# Patient Record
Sex: Female | Born: 1985 | Race: Black or African American | Hispanic: No | Marital: Single
Health system: Midwestern US, Community
[De-identification: ages and names within clinical notes are randomized; demographics above are authoritative.]

## PROBLEM LIST (undated history)

## (undated) DIAGNOSIS — Z789 Other specified health status: Secondary | ICD-10-CM

## (undated) DIAGNOSIS — O24419 Gestational diabetes mellitus in pregnancy, unspecified control: Secondary | ICD-10-CM

## (undated) HISTORY — PX: FOOT SURGERY: SHX648

---
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
## 2004-01-19 ENCOUNTER — Inpatient Hospital Stay (HOSPITAL_COMMUNITY): Admission: AD | Admit: 2004-01-19 | Discharge: 2004-01-19 | Payer: Self-pay | Admitting: *Deleted

## 2006-08-27 ENCOUNTER — Emergency Department (HOSPITAL_COMMUNITY): Admission: EM | Admit: 2006-08-27 | Discharge: 2006-08-27 | Payer: Self-pay | Admitting: Emergency Medicine

## 2006-10-07 ENCOUNTER — Encounter: Admission: RE | Admit: 2006-10-07 | Discharge: 2006-10-07 | Payer: Self-pay | Admitting: Family Medicine

## 2007-02-12 ENCOUNTER — Ambulatory Visit: Payer: Self-pay | Admitting: Internal Medicine

## 2007-08-29 ENCOUNTER — Emergency Department (HOSPITAL_COMMUNITY): Admission: EM | Admit: 2007-08-29 | Discharge: 2007-08-29 | Payer: Self-pay | Admitting: Emergency Medicine

## 2007-08-31 ENCOUNTER — Emergency Department (HOSPITAL_COMMUNITY): Admission: EM | Admit: 2007-08-31 | Discharge: 2007-08-31 | Payer: Self-pay | Admitting: Family Medicine

## 2008-05-21 ENCOUNTER — Emergency Department (HOSPITAL_COMMUNITY): Admission: EM | Admit: 2008-05-21 | Discharge: 2008-05-21 | Payer: Self-pay | Admitting: Emergency Medicine

## 2008-05-24 ENCOUNTER — Encounter: Admission: RE | Admit: 2008-05-24 | Discharge: 2008-05-24 | Payer: Self-pay | Admitting: Family Medicine

## 2008-06-23 ENCOUNTER — Emergency Department (HOSPITAL_COMMUNITY): Admission: EM | Admit: 2008-06-23 | Discharge: 2008-06-23 | Payer: Self-pay | Admitting: Emergency Medicine

## 2009-04-04 ENCOUNTER — Emergency Department (HOSPITAL_COMMUNITY): Admission: EM | Admit: 2009-04-04 | Discharge: 2009-04-04 | Payer: Self-pay | Admitting: Emergency Medicine

## 2009-05-20 ENCOUNTER — Emergency Department (HOSPITAL_COMMUNITY): Admission: EM | Admit: 2009-05-20 | Discharge: 2009-05-20 | Payer: Self-pay | Admitting: Emergency Medicine

## 2009-08-10 ENCOUNTER — Emergency Department (HOSPITAL_COMMUNITY): Admission: EM | Admit: 2009-08-10 | Discharge: 2009-08-10 | Payer: Self-pay | Admitting: Emergency Medicine

## 2009-09-03 ENCOUNTER — Other Ambulatory Visit: Admission: RE | Admit: 2009-09-03 | Discharge: 2009-09-03 | Payer: Self-pay | Admitting: Family Medicine

## 2010-05-05 NOTE — L&D Delivery Note (Signed)
Delivery Note At 10:23 PM a viable female was delivered via Vaginal, Spontaneous Delivery (Presentation: Left Occiput Anterior).  APGAR: 9, ; weight .   Placenta status: Intact, Spontaneous.  Cord: 3 vessels with the following complications: None.   Anesthesia: None  Episiotomy: None Lacerations: None Est. Blood Loss (mL): 700  Mom to postpartum.  Baby to nursery-stable.  FRAZIER,NATALIE 04/08/2011, 11:43 PM

## 2010-07-24 LAB — URINALYSIS, ROUTINE W REFLEX MICROSCOPIC
Bilirubin Urine: NEGATIVE
Glucose, UA: NEGATIVE mg/dL
Ketones, ur: NEGATIVE mg/dL
Nitrite: NEGATIVE
Protein, ur: NEGATIVE mg/dL
pH: 6.5 (ref 5.0–8.0)

## 2010-07-24 LAB — POCT PREGNANCY, URINE: Preg Test, Ur: NEGATIVE

## 2010-08-12 ENCOUNTER — Inpatient Hospital Stay (INDEPENDENT_AMBULATORY_CARE_PROVIDER_SITE_OTHER)
Admission: RE | Admit: 2010-08-12 | Discharge: 2010-08-12 | Disposition: A | Discharge status: Home / Self Care | Payer: BC Managed Care – PPO | Source: Ambulatory Visit | Attending: Emergency Medicine | Admitting: Emergency Medicine

## 2010-08-12 DIAGNOSIS — Z331 Pregnant state, incidental: Secondary | ICD-10-CM

## 2010-08-12 LAB — POCT URINALYSIS DIP (DEVICE)
Glucose, UA: NEGATIVE mg/dL
Ketones, ur: NEGATIVE mg/dL
pH: 7 (ref 5.0–8.0)

## 2010-08-12 LAB — POCT PREGNANCY, URINE: Preg Test, Ur: POSITIVE

## 2010-08-15 ENCOUNTER — Inpatient Hospital Stay (HOSPITAL_COMMUNITY)
Admission: AD | Admit: 2010-08-15 | Discharge: 2010-08-15 | Disposition: A | Discharge status: Home / Self Care | Payer: BC Managed Care – PPO | Source: Ambulatory Visit | Attending: Obstetrics & Gynecology | Admitting: Obstetrics & Gynecology

## 2010-08-15 DIAGNOSIS — O99891 Other specified diseases and conditions complicating pregnancy: Secondary | ICD-10-CM

## 2010-08-15 DIAGNOSIS — O9989 Other specified diseases and conditions complicating pregnancy, childbirth and the puerperium: Secondary | ICD-10-CM

## 2010-08-31 ENCOUNTER — Emergency Department (HOSPITAL_COMMUNITY)
Admission: EM | Admit: 2010-08-31 | Discharge: 2010-09-01 | Disposition: A | Discharge status: Home / Self Care | Payer: BC Managed Care – PPO | Attending: Emergency Medicine | Admitting: Emergency Medicine

## 2010-08-31 DIAGNOSIS — Y92009 Unspecified place in unspecified non-institutional (private) residence as the place of occurrence of the external cause: Secondary | ICD-10-CM | POA: Insufficient documentation

## 2010-08-31 DIAGNOSIS — H5789 Other specified disorders of eye and adnexa: Secondary | ICD-10-CM | POA: Insufficient documentation

## 2010-08-31 DIAGNOSIS — S0010XA Contusion of unspecified eyelid and periocular area, initial encounter: Secondary | ICD-10-CM | POA: Insufficient documentation

## 2010-09-01 ENCOUNTER — Emergency Department (HOSPITAL_COMMUNITY): Payer: BC Managed Care – PPO

## 2010-09-01 LAB — POCT PREGNANCY, URINE: Preg Test, Ur: POSITIVE

## 2010-10-15 ENCOUNTER — Other Ambulatory Visit: Payer: Self-pay | Admitting: Obstetrics and Gynecology

## 2010-10-16 LAB — RUBELLA ANTIBODY, IGM: Rubella: IMMUNE

## 2010-10-16 LAB — HEPATITIS B SURFACE ANTIGEN: Hepatitis B Surface Ag: NEGATIVE

## 2010-10-16 LAB — ABO/RH: RH Type: POSITIVE

## 2010-10-16 LAB — HIV ANTIBODY (ROUTINE TESTING W REFLEX): HIV: NONREACTIVE

## 2011-04-08 ENCOUNTER — Encounter (HOSPITAL_COMMUNITY): Payer: Self-pay | Admitting: *Deleted

## 2011-04-08 ENCOUNTER — Inpatient Hospital Stay (HOSPITAL_COMMUNITY)
Admission: AD | Admit: 2011-04-08 | Discharge: 2011-04-10 | DRG: 775 | Disposition: A | Discharge status: Home / Self Care | Payer: Medicaid Other | Source: Ambulatory Visit | Attending: Obstetrics & Gynecology | Admitting: Obstetrics & Gynecology

## 2011-04-08 HISTORY — DX: Other specified health status: Z78.9

## 2011-04-08 MED ORDER — OXYTOCIN 10 UNIT/ML IJ SOLN
INTRAMUSCULAR | Status: AC
  Administered 2011-04-08: 10 [IU] via INTRAMUSCULAR
  Filled 2011-04-08: qty 1

## 2011-04-08 MED ORDER — OXYTOCIN 10 UNIT/ML IJ SOLN
10.0000 [IU] | Freq: Once | INTRAMUSCULAR | Status: AC
  Administered 2011-04-08: 10 [IU] via INTRAMUSCULAR

## 2011-04-08 MED ORDER — ERYTHROMYCIN 5 MG/GM OP OINT
TOPICAL_OINTMENT | OPHTHALMIC | Status: AC
  Filled 2011-04-08: qty 1

## 2011-04-08 MED ORDER — FENTANYL CITRATE 0.05 MG/ML IJ SOLN: 100.0000 ug | Freq: Once | INTRAMUSCULAR | Status: DC

## 2011-04-08 MED ORDER — OXYTOCIN 20 UNITS IN LACTATED RINGERS INFUSION - SIMPLE
INTRAVENOUS | Status: AC
  Administered 2011-04-08: 20 [IU] via INTRAVENOUS
  Filled 2011-04-08: qty 1000

## 2011-04-08 MED ORDER — OXYTOCIN 20 UNITS IN LACTATED RINGERS INFUSION - SIMPLE
125.0000 mL/h | INTRAVENOUS | Status: DC | PRN
  Administered 2011-04-08: 20 [IU] via INTRAVENOUS

## 2011-04-08 MED ORDER — FENTANYL CITRATE 0.05 MG/ML IJ SOLN
100.0000 ug | Freq: Once | INTRAMUSCULAR | Status: AC
  Administered 2011-04-08: 100 ug via INTRAVENOUS

## 2011-04-08 MED ORDER — FENTANYL CITRATE 0.05 MG/ML IJ SOLN
INTRAMUSCULAR | Status: AC
  Administered 2011-04-08: 100 ug via INTRAVENOUS
  Filled 2011-04-08: qty 2

## 2011-04-08 NOTE — Progress Notes (Signed)
Contractions present since last night

## 2011-04-08 NOTE — H&P (Signed)
25 year old G1 now P1 who presented to [facility]U at 39 weeks and precipitously delivered @10 :23PM.  I was called by [facility]U at 10:21PM.  Upon my arrival, patient had delivered a viable female infant with Apgars of 9/9 and placenta had already been delivered as well.  Inspection of perineum found no significant tears.  No repairs.  Breast feeding. From history, -GBS.  Ob record available only goes up to mid September.  Was seen in office this AM, contracting some, and was told she was 2cm dilated.  By history had been contracting for two days, although contractions increased in frequency and severity around 9:30 PM this evening.  SROM at 10:20PM.  PE:  VS stable. Abd:  U-2 and Firm.   Bleeding minimal.  IMP:  Term IUP, Delivered Female precipitously in [facility]U Plan:  PP care.

## 2011-04-08 NOTE — ED Provider Notes (Signed)
Pt arrived in [facility]U in active labor, SVE: complete/+2, + FHR, delivered vigorous infant with 1 push, see delivery record. Placenta delivered intact. Uterus boggy immediately postpartum firms with massage, continuous bleeding, 10 u Pitocin given IM, fundus firm following, bleeding stable, IV started, 20 u pitocin in 1000 ml LR initiated. EBL 700. Couplet stable.

## 2011-04-09 ENCOUNTER — Encounter (HOSPITAL_COMMUNITY): Payer: Self-pay

## 2011-04-09 LAB — CBC
Platelets: 230 10*3/uL (ref 150–400)
RBC: 3.97 MIL/uL (ref 3.87–5.11)
WBC: 16.8 10*3/uL — ABNORMAL HIGH (ref 4.0–10.5)

## 2011-04-09 MED ORDER — PRENATAL PLUS 27-1 MG PO TABS
1.0000 | ORAL_TABLET | Freq: Every day | ORAL | Status: DC
  Administered 2011-04-09 – 2011-04-10 (×2): 1 via ORAL
  Filled 2011-04-09 (×2): qty 1

## 2011-04-09 MED ORDER — IBUPROFEN 600 MG PO TABS
600.0000 mg | ORAL_TABLET | Freq: Four times a day (QID) | ORAL | Status: DC
  Administered 2011-04-09 – 2011-04-10 (×6): 600 mg via ORAL
  Filled 2011-04-09 (×6): qty 1

## 2011-04-09 MED ORDER — LANOLIN HYDROUS EX OINT: TOPICAL_OINTMENT | CUTANEOUS | Status: DC | PRN

## 2011-04-09 MED ORDER — ONDANSETRON HCL 4 MG PO TABS: 4.0000 mg | ORAL_TABLET | ORAL | Status: DC | PRN

## 2011-04-09 MED ORDER — SIMETHICONE 80 MG PO CHEW: 80.0000 mg | CHEWABLE_TABLET | ORAL | Status: DC | PRN

## 2011-04-09 MED ORDER — ONDANSETRON HCL 4 MG/2ML IJ SOLN: 4.0000 mg | INTRAMUSCULAR | Status: DC | PRN

## 2011-04-09 MED ORDER — SENNOSIDES-DOCUSATE SODIUM 8.6-50 MG PO TABS
2.0000 | ORAL_TABLET | Freq: Every day | ORAL | Status: DC
  Administered 2011-04-09: 2 via ORAL

## 2011-04-09 MED ORDER — ZOLPIDEM TARTRATE 5 MG PO TABS: 5.0000 mg | ORAL_TABLET | Freq: Every evening | ORAL | Status: DC | PRN

## 2011-04-09 MED ORDER — TETANUS-DIPHTH-ACELL PERTUSSIS 5-2.5-18.5 LF-MCG/0.5 IM SUSP
0.5000 mL | Freq: Once | INTRAMUSCULAR | Status: AC
  Administered 2011-04-10: 0.5 mL via INTRAMUSCULAR
  Filled 2011-04-09: qty 0.5

## 2011-04-09 MED ORDER — BENZOCAINE-MENTHOL 20-0.5 % EX AERO
INHALATION_SPRAY | CUTANEOUS | Status: AC
  Filled 2011-04-09: qty 56

## 2011-04-09 MED ORDER — WITCH HAZEL-GLYCERIN EX PADS: 1.0000 "application " | MEDICATED_PAD | CUTANEOUS | Status: DC | PRN

## 2011-04-09 MED ORDER — BENZOCAINE-MENTHOL 20-0.5 % EX AERO
1.0000 "application " | INHALATION_SPRAY | CUTANEOUS | Status: DC | PRN
  Administered 2011-04-09: 1 via TOPICAL

## 2011-04-09 MED ORDER — OXYCODONE-ACETAMINOPHEN 5-325 MG PO TABS
1.0000 | ORAL_TABLET | ORAL | Status: DC | PRN
  Administered 2011-04-09: 1 via ORAL
  Administered 2011-04-09: 2 via ORAL
  Filled 2011-04-09: qty 1
  Filled 2011-04-09: qty 2

## 2011-04-09 MED ORDER — DIPHENHYDRAMINE HCL 25 MG PO CAPS: 25.0000 mg | ORAL_CAPSULE | Freq: Four times a day (QID) | ORAL | Status: DC | PRN

## 2011-04-09 MED ORDER — DIBUCAINE 1 % RE OINT: 1.0000 "application " | TOPICAL_OINTMENT | RECTAL | Status: DC | PRN

## 2011-04-09 NOTE — Progress Notes (Signed)
Post Partum Day 1 Subjective: no complaints  Objective: Blood pressure 99/68, pulse 85, temperature 98.1 F (36.7 C), temperature source Axillary, resp. rate 18, height 5\' 4"  (1.626 m), weight 78.019 kg (172 lb), SpO2 99.00%, unknown if currently breastfeeding.  Physical Exam:  General: alert Lochia: appropriate Uterine Fundus: firm   Basename 04/09/11 0515  HGB 9.6*  HCT 28.9*    Assessment/Plan: Plan for discharge tomorrow   LOS: 1 day   Esma Kilts D 04/09/2011, 10:02 AM

## 2011-04-09 NOTE — Progress Notes (Signed)
UR chart review completed.  

## 2011-04-10 NOTE — Discharge Summary (Signed)
Obstetric Discharge Summary Reason for Admission: onset of labor Prenatal Procedures: none Intrapartum Procedures: spontaneous vaginal delivery Postpartum Procedures: none Complications-Operative and Postpartum: none Hemoglobin  Date Value Range Status  04/09/2011 9.6* 12.0-15.0 (g/dL) Final     HCT  Date Value Range Status  04/09/2011 28.9* 36.0-46.0 (%) Final    Discharge Diagnoses: Term Pregnancy-delivered  Discharge Information: Date: 04/10/2011 Activity: pelvic rest Diet: routine Medications: Ibuprofen Condition: stable Instructions: refer to practice specific booklet Discharge to: home Follow-up Information    Follow up with Caprice [facility]. Make an appointment in 4 weeks.   Contact information:   [address] [place] [address] [phone]          Newborn Data: Live born female  Birth Weight: 5 lb 11 oz (2580 g) APGAR: 9,   Home with mother.  Areanna Gengler A 04/10/2011, 7:36 AM

## 2011-04-10 NOTE — Progress Notes (Signed)
Patient is eating, ambulating, voiding.  Pain control is good.  Filed Vitals:   04/09/11 1030 04/09/11 1347 04/09/11 2120 04/10/11 0603  BP: 126/82 122/68 126/75 122/82  Pulse: 111 98 100 111  Temp: 98.5 F (36.9 C) 98.6 F (37 C) 98.3 F (36.8 C) 98.4 F (36.9 C)  TempSrc: Oral Oral Oral Oral  Resp: 18 18 18 18   Height:      Weight:      SpO2:        Fundus firm Perineum without swelling.  Lab Results  Component Value Date   WBC 16.8* 04/09/2011   HGB 9.6* 04/09/2011   HCT 28.9* 04/09/2011   MCV 72.8* 04/09/2011   PLT 230 04/09/2011    A/Positive/-- (06/13 0000)/RI  A/P Post partum day 2.  Routine care.  Expect d/c today.    Jaiyon Wander A

## 2011-04-15 ENCOUNTER — Institutional Professional Consult (permissible substitution): Payer: Medicaid Other | Admitting: Pediatrics
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
## 2014-03-06 ENCOUNTER — Encounter (HOSPITAL_COMMUNITY): Payer: Self-pay

## 2014-08-12 ENCOUNTER — Emergency Department (HOSPITAL_COMMUNITY): Payer: BLUE CROSS/BLUE SHIELD

## 2014-08-12 ENCOUNTER — Emergency Department (HOSPITAL_COMMUNITY)
Admission: EM | Admit: 2014-08-12 | Discharge: 2014-08-12 | Disposition: A | Discharge status: Home / Self Care | Payer: BLUE CROSS/BLUE SHIELD | Attending: Emergency Medicine | Admitting: Emergency Medicine

## 2014-08-12 ENCOUNTER — Encounter (HOSPITAL_COMMUNITY): Payer: Self-pay | Admitting: Emergency Medicine

## 2014-08-12 DIAGNOSIS — Z3202 Encounter for pregnancy test, result negative: Secondary | ICD-10-CM | POA: Diagnosis not present

## 2014-08-12 DIAGNOSIS — R002 Palpitations: Secondary | ICD-10-CM | POA: Diagnosis not present

## 2014-08-12 DIAGNOSIS — Z720 Tobacco use: Secondary | ICD-10-CM | POA: Insufficient documentation

## 2014-08-12 DIAGNOSIS — R079 Chest pain, unspecified: Secondary | ICD-10-CM

## 2014-08-12 LAB — URINALYSIS, ROUTINE W REFLEX MICROSCOPIC
Bilirubin Urine: NEGATIVE
Glucose, UA: NEGATIVE mg/dL
KETONES UR: NEGATIVE mg/dL
Leukocytes, UA: NEGATIVE
NITRITE: NEGATIVE
PROTEIN: NEGATIVE mg/dL
Specific Gravity, Urine: 1.005 (ref 1.005–1.030)
Urobilinogen, UA: 0.2 mg/dL (ref 0.0–1.0)
pH: 7 (ref 5.0–8.0)

## 2014-08-12 LAB — URINE MICROSCOPIC-ADD ON

## 2014-08-12 LAB — CBC
HEMATOCRIT: 39.3 % (ref 36.0–46.0)
Hemoglobin: 12.9 g/dL (ref 12.0–15.0)
MCH: 24.4 pg — AB (ref 26.0–34.0)
MCHC: 32.8 g/dL (ref 30.0–36.0)
MCV: 74.3 fL — ABNORMAL LOW (ref 78.0–100.0)
Platelets: 284 10*3/uL (ref 150–400)
RBC: 5.29 MIL/uL — ABNORMAL HIGH (ref 3.87–5.11)
RDW: 13.3 % (ref 11.5–15.5)
WBC: 9.4 10*3/uL (ref 4.0–10.5)

## 2014-08-12 LAB — BASIC METABOLIC PANEL
Anion gap: 10 (ref 5–15)
BUN: 10 mg/dL (ref 6–23)
CHLORIDE: 103 mmol/L (ref 96–112)
CO2: 24 mmol/L (ref 19–32)
Calcium: 8.8 mg/dL (ref 8.4–10.5)
Creatinine, Ser: 0.79 mg/dL (ref 0.50–1.10)
GFR calc Af Amer: >90 mL/min (ref >90)
GLUCOSE: 131 mg/dL — AB (ref 70–99)
POTASSIUM: 3.3 mmol/L — AB (ref 3.5–5.1)
Sodium: 137 mmol/L (ref 135–145)

## 2014-08-12 LAB — I-STAT TROPONIN, ED: Troponin i, poc: 0 ng/mL (ref 0.00–0.08)

## 2014-08-12 LAB — POC URINE PREG, ED: PREG TEST UR: NEGATIVE
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
## 2014-08-12 NOTE — ED Provider Notes (Signed)
CSN: [id number]     Arrival date & time 08/12/14  1334 History   First MD Initiated Contact with Patient 08/12/14 1355     Chief Complaint  Patient presents with  . Chest Pain     (Consider location/radiation/quality/duration/timing/severity/associated sxs/prior Treatment) Patient is a 29 y.o. female presenting with palpitations.  Palpitations Palpitations quality:  Fast Onset quality:  Sudden Duration:  30 minutes Timing:  Constant Progression:  Resolved Chronicity:  Recurrent Context: not caffeine (quit drinking red bull 2 weeks ago), not nicotine (quit smoking a  few days ago) and not stimulant use   Relieved by: food. Worsened by:  Nothing Associated symptoms: no chest pain ("funny feeling in chest"), no diaphoresis, no dizziness, no nausea, no shortness of breath (had feeling of needing to breath deeply, no SOB.  ) and no vomiting     Past Medical History  Diagnosis Date  . No pertinent past medical history    Past Surgical History  Procedure Laterality Date  . Foot surgery     Family History  Problem Relation Age of Onset  . Diabetes Maternal Grandfather   . Diabetes Paternal Grandmother    History  Substance Use Topics  . Smoking status: Current Some Day Smoker    Types: Cigarettes  . Smokeless tobacco: Not on file  . Alcohol Use: No   OB History    Gravida Para Term Preterm AB TAB SAB Ectopic Multiple Living   1 1 1       1      Review of Systems  Constitutional: Negative for diaphoresis.  Respiratory: Negative for shortness of breath (had feeling of needing to breath deeply, no SOB.  ).   Cardiovascular: Positive for palpitations. Negative for chest pain ("funny feeling in chest").  Gastrointestinal: Negative for nausea and vomiting.  Neurological: Negative for dizziness.  All other systems reviewed and are negative.     Allergies  Review of patient's allergies indicates no known allergies.  Home Medications   Prior to Admission medications    Not on File   BP 141/84 mmHg  Pulse 101  Temp(Src) 98.9 F (37.2 C)  Resp 20  SpO2 100%  LMP  (LMP Unknown) Physical Exam  Constitutional: She is oriented to person, place, and time. She appears well-developed and well-nourished. No distress.  HENT:  Head: Normocephalic and atraumatic.  Mouth/Throat: Oropharynx is clear and moist.  Eyes: Conjunctivae are normal. Pupils are equal, round, and reactive to light. No scleral icterus.  Neck: Neck supple.  Cardiovascular: Normal rate, regular rhythm, normal heart sounds and intact distal pulses.   No murmur heard. Pulmonary/Chest: Effort normal and breath sounds normal. No stridor. No respiratory distress. She has no rales.  Abdominal: Soft. Bowel sounds are normal. She exhibits no distension. There is no tenderness.  Musculoskeletal: Normal range of motion.  Neurological: She is alert and oriented to person, place, and time.  Skin: Skin is warm and dry. No rash noted.  Psychiatric: She has a normal mood and affect. Her behavior is normal.  Nursing note and vitals reviewed.   ED Course  Procedures (including critical care time) Labs Review Labs Reviewed  CBC - Abnormal; Notable for the following:    RBC 5.29 (*)    MCV 74.3 (*)    MCH 24.4 (*)    All other components within normal limits  BASIC METABOLIC PANEL - Abnormal; Notable for the following:    Potassium 3.3 (*)    Glucose, Bld 131 (*)  All other components within normal limits  I-STAT TROPOININ, ED    Imaging Review Dg Chest 2 View  08/12/2014   CLINICAL DATA:  Left-sided chest pain  EXAM: CHEST  2 VIEW  COMPARISON:  05/20/2009  FINDINGS: The heart size and mediastinal contours are within normal limits. Both lungs are clear. The visualized skeletal structures are unremarkable.  IMPRESSION: No active cardiopulmonary disease.   Electronically Signed   By: Signa Kell M.D.   On: 08/12/2014 14:44  All radiology studies independently viewed by me.      EKG  Interpretation   Date/Time:  Saturday August 12 2014 13:46:52 EDT Ventricular Rate:  87 PR Interval:  151 QRS Duration: 93 QT Interval:  371 QTC Calculation: 446 R Axis:   71 Text Interpretation:  Sinus arrhythmia Borderline T wave abnormalities No  old tracing to compare Confirmed by [facility]  MD, TREY ([id number]) on 08/12/2014  2:09:55 PM      MDM   Final diagnoses:  Palpitations    29 yo female with palpitations.  Pt denied chest pain and SOB on my interview.  Also denied syncope, nausea, dizziness.  Symptoms had resolved prior to my evaluation.  They seemed to get better after eating.  Her symptoms are inconsistent with ACS or PE.  Could be anxiety or perhaps be related to quitting caffeine and tobacco.  Her ED workup is reassuring, but she will need outpatient follow up.  Given return precautions.    Blake Divine, MD 08/12/14 [phone]

## 2014-08-12 NOTE — Discharge Instructions (Signed)
Palpitations A palpitation is the feeling that your heartbeat is irregular or is faster than normal. It may feel like your heart is fluttering or skipping a beat. Palpitations are usually not a serious problem. However, in some cases, you may need further medical evaluation. CAUSES  Palpitations can be caused by:  Smoking.  Caffeine or other stimulants, such as diet pills or energy drinks.  Alcohol.  Stress and anxiety.  Strenuous physical activity.  Fatigue.  Certain medicines.  Heart disease, especially if you have a history of irregular heart rhythms (arrhythmias), such as atrial fibrillation, atrial flutter, or supraventricular tachycardia.  An improperly working pacemaker or defibrillator. DIAGNOSIS  To find the cause of your palpitations, your health care provider will take your medical history and perform a physical exam. Your health care provider may also have you take a test called an ambulatory electrocardiogram (ECG). An ECG records your heartbeat patterns over a 24-hour period. You may also have other tests, such as:  Transthoracic echocardiogram (TTE). During echocardiography, sound waves are used to evaluate how blood flows through your heart.  Transesophageal echocardiogram (TEE).  Cardiac monitoring. This allows your health care provider to monitor your heart rate and rhythm in real time.  Holter monitor. This is a portable device that records your heartbeat and can help diagnose heart arrhythmias. It allows your health care provider to track your heart activity for several days, if needed.  Stress tests by exercise or by giving medicine that makes the heart beat faster. TREATMENT  Treatment of palpitations depends on the cause of your symptoms and can vary greatly. Most cases of palpitations do not require any treatment other than time, relaxation, and monitoring your symptoms. Other causes, such as atrial fibrillation, atrial flutter, or supraventricular  tachycardia, usually require further treatment. HOME CARE INSTRUCTIONS   Avoid:  Caffeinated coffee, tea, soft drinks, diet pills, and energy drinks.  Chocolate.  Alcohol.  Stop smoking if you smoke.  Reduce your stress and anxiety. Things that can help you relax include:  A method of controlling things in your body, such as your heartbeats, with your mind (biofeedback).  Yoga.  Meditation.  Physical activity such as swimming, jogging, or walking.  Get plenty of rest and sleep. SEEK MEDICAL CARE IF:   You continue to have a fast or irregular heartbeat beyond 24 hours.  Your palpitations occur more often. SEEK IMMEDIATE MEDICAL CARE IF:  You have chest pain or shortness of breath.  You have a severe headache.  You feel dizzy or you faint. MAKE SURE YOU:  Understand these instructions.  Will watch your condition.  Will get help right away if you are not doing well or get worse. Document Released: 04/18/2000 Document Revised: 04/26/2013 Document Reviewed: 06/20/2011 [facility] Patient Information 2015 [employer], [place]. This information is not intended to replace advice given to you by your health care provider. Make sure you discuss any questions you have with your health care provider.   Emergency Department Resource Guide 1) Find a Doctor and Pay Out of Pocket Although you won't have to find out who is covered by your insurance plan, it is a good idea to ask around and get recommendations. You will then need to call the office and see if the doctor you have chosen will accept you as a new patient and what types of options they offer for patients who are self-pay. Some doctors offer discounts or will set up payment plans for their patients who do not have insurance, but  you will need to ask so you aren't surprised when you get to your appointment.  2) Contact Your Local Health Department Not all health departments have doctors that can see patients for sick visits, but  many do, so it is worth a call to see if yours does. If you don't know where your local health department is, you can check in your phone book. The CDC also has a tool to help you locate your state's health department, and many state websites also have listings of all of their local health departments.  3) Find a Elim Clinic If your illness is not likely to be very severe or complicated, you may want to try a walk in clinic. These are popping up all over the country in pharmacies, drugstores, and shopping centers. They're usually staffed by nurse practitioners or physician assistants that have been trained to treat common illnesses and complaints. They're usually fairly quick and inexpensive. However, if you have serious medical issues or chronic medical problems, these are probably not your best option.  No Primary Care Doctor: - Call Health Connect at  [phone] - they can help you locate a primary care doctor that  accepts your insurance, provides certain services, etc. - Physician Referral Service- [phone]  Chronic Pain Problems: Organization         Address  Phone   Notes  [facility]  [phone] Patients need to be referred by their primary care doctor.   Medication Assistance: Organization         Address  Phone   Notes  [facility] Medication [facility] [address] [place], [place] [address] [phone] --Must be a resident of [facility] -- Must have NO insurance coverage whatsoever (no Medicaid/ Medicare, etc.) -- The pt. MUST have a primary care doctor that directs their care regularly and follows them in the community   Med[facility]ist  [phone]   [employer]  [phone]    Agencies that provide inexpensive medical care: Organization         Address  Phone   Notes  [place]  [phone]   [facility]    [phone]   [facility] [place], [place] [address] [phone]   [place] [address], [place] [phone]   Planned Parenthood    [phone]   [facility]    [phone]   [place] and [place] [address], [place] Phone:  [phone], Fax:  [phone] Hours of Operation:  9 am - 6 pm, M-F.  Also accepts Medicaid/Medicare and self-pay.  [facility] [place] [address], [address], [place] Phone: [phone], Fax: [phone]. Hours of Operation:  8:30 am - 5:30 pm, M-F.  Also accepts Medicaid and self-pay.  [facility] [address] [place] Phone: [phone]   [place], [place], [place] [phone], Ext. [phone] Mondays & Thursdays: 7-9 AM.  First 15 patients are seen on a first come, first serve basis.    [place] Providers:  Organization         Address  Phone   Notes  [facility] [address], [place] [phone] Also accepts self-pay patients.  [facility] [address], [place]  [place]  [phone]   [place], [address], [place] [phone]   [place] [address] [place] [phone]   Lucianne Lei [address], [place]   [phone] Only accepts [place] Access [place] patients after they have their name applied to their card.   Self-Pay (no insurance) in [facility]:  Organization         Address  Phone   Notes  Sickle Cell Patients, [facility] [place] [phone]   [facility] [place] [phone]   [facility] [place]  [place], [place], [place] [phone]   [facility]/Dr. Osei-Bonsu  [address] [place] or  [address], [place] [phone] Phone number for both [place] and [place] locations is the same.  [facility] [address] [place] [phone]   [facility] [address], [place] or [address] [phone]   [facility] [address], [place] [phone], phone; [phone], fax Sees patients 1st and 3rd Saturday of every month.  Must not qualify for public or private insurance (i.e. Medicaid, Medicare, [place] Health Choice, Veterans' Benefits)  Household income should be no more than 200% of the poverty level The clinic cannot treat you if you are pregnant or think you are pregnant  Sexually transmitted diseases are not treated at the clinic.    Dental Care: Organization         Address  Phone  Notes  [facility] Department of [facility] [phone] Accepts children up to age 31 who are enrolled in [place] or [place]; pregnant women with a Medicaid card; and children who have applied for Medicaid or [place] Health Choice, but were declined, whose parents can pay a reduced fee at time of service.  [facility] Department of [facility]  [address], [place] [phone] Accepts children up to age 43 who are enrolled in [place] or [place]; pregnant women with a Medicaid card; and children who have applied for Medicaid or [place] Health Choice, but were declined, whose parents can pay a reduced fee at time of service.  [place] Adult [facility] Access PROGRAM  [place] [phone] Patients are seen by appointment only. Walk-ins are not accepted. [place] will see patients 41 years of age and older. Monday - Tuesday (8am-5pm) Most Wednesdays (8:30-5pm) $30 per visit, cash only  [facility] Adult Dental Access PROGRAM  [address], [facility] [phone] Patients are seen by appointment only. Walk-ins are not accepted. [place] will see patients 43 years of age and older. One Wednesday Evening (Monthly: Volunteer Based).  $30 per visit, cash only  [place]  [phone] for adults; Children under age 67, call Graduate Pediatric Dentistry at [phone]. Children aged 40-14, please call [phone] to request a pediatric application.  Dental services are provided in all areas of dental care including fillings, crowns and bridges, complete and partial dentures, implants, gum treatment, root canals, and extractions. Preventive care is also provided. Treatment is provided to both adults and children. Patients are selected via a lottery  and there is often a waiting list.   [facility] [address] [place]  [phone] [email or web address]   [facility] [address] [place], [place] [phone], Ext. [phone] Second and Fourth Thursday of each month, opens at 6:30 AM; Clinic ends at 9 AM.  Patients are seen on a first-come first-served basis, and a limited number are seen during each clinic.   [facility]  [address] Hillard Danker [place], [place] [phone]   Eligibility Requirements You must have lived in [place], [place], or [place] counties for at least the last three months.   You cannot be eligible for state or federal sponsored [employer], including [employer], [place], or [employer].   You generally cannot be eligible for healthcare insurance through your employer.    How to apply: Eligibility screenings are held every Tuesday and Wednesday afternoon from 1:00 pm until 4:00 pm. You do not need an appointment for the interview!  [facility] [address], [place], [place]   [place]  Blue Department  [place]  [phone]    Behavioral Health Resources in the Community: Intensive Outpatient Programs Organization         Address  Phone  Notes  [place]. [address] [place], [place] [phone]   [facility] Outpatient [address] [place], [place]   ADS: Alcohol & Drug Svcs [address], [place], [place]   [place] [address],  [place], [place] or [phone]   Substance Abuse Resources Organization         Address  Phone  Notes  Alcohol and Drug Services  [phone]   [place]  [phone]   [facility] [place]   Chinita Pester  [phone]   Residential & Outpatient Substance Abuse Program  [phone]   Psychological Services Organization         Address  Phone  Notes  [facility] [place]  [place]  [phone]   [place] [address], [place] or [phone]    Mobile Crisis Teams Organization         Address  Phone  Notes  [facility], Mobile Crisis Care Unit  [phone]   [facility] Psych[facility]  [address] [place], [place]   [place] [address], [place] [phone]    Self-Help/Support Groups Organization         Address  Phone             Notes  [place] - variety of support groups  [place] Call for more information  Narcotics Anonymous (NA), [facility] [address], [employer] [place]  2 meetings at this location   Special educational needs teacher         Address  Phone  Notes  ASAP Residential Treatment [place],    [place]  [phone]-([phone]   [facility]  [address], [place] [id number], [place], [place]   [place], [place] [phone]  Admissions: 8am-3pm M-F  Incentives Substance [place] [address]    [place], [place] [phone]   [facility] [address], [place],  [place] [phone]   [facility] [address]  [place], [place]   [facility] - Intensive Outpatient [address], [place], [place]   [facility] ([place].,  [place], [place] [phone] or [phone]   [facility] (RTS) [address] [place], [place] Accepts Medicaid  [facility] [place] [address],  [place] [phone] Substance Abuse/Addiction Treatment   [facility] Organization         Address  Phone  Notes  [facility]  [phone]   Domenic Schwab, PhD [address] Arlis Porta [place], [place]   [phone] or [phone]   [place], [place] [phone]   [address], [place], [place] [phone] Insurance/Medicaid/sponsorship through [facility] and Families [address] [place]                                    [place], [place] [phone] [place] [address], [place] [phone]    Dr. Adele Schilder  [phone]   [facility] Dept. 1) [address] [place] 2) [place] 3)  [place] [address], [place] [phone]  [phone]   [place] [phone] or [phone] (After Hours)

## 2014-08-12 NOTE — ED Notes (Signed)
Md Wofford at bedside.  

## 2015-08-23 DIAGNOSIS — J019 Acute sinusitis, unspecified: Secondary | ICD-10-CM | POA: Diagnosis not present

## 2015-08-23 DIAGNOSIS — J209 Acute bronchitis, unspecified: Secondary | ICD-10-CM | POA: Diagnosis not present

## 2015-09-10 DIAGNOSIS — R002 Palpitations: Secondary | ICD-10-CM | POA: Diagnosis not present

## 2015-09-10 DIAGNOSIS — Z3046 Encounter for surveillance of implantable subdermal contraceptive: Secondary | ICD-10-CM | POA: Diagnosis not present

## 2015-09-10 DIAGNOSIS — F411 Generalized anxiety disorder: Secondary | ICD-10-CM | POA: Diagnosis not present

## 2015-09-14 DIAGNOSIS — R002 Palpitations: Secondary | ICD-10-CM | POA: Diagnosis not present

## 2015-09-15 DIAGNOSIS — R002 Palpitations: Secondary | ICD-10-CM | POA: Diagnosis not present

## 2015-09-18 DIAGNOSIS — F411 Generalized anxiety disorder: Secondary | ICD-10-CM | POA: Diagnosis not present

## 2015-10-19 ENCOUNTER — Other Ambulatory Visit (HOSPITAL_COMMUNITY)
Admission: RE | Admit: 2015-10-19 | Discharge: 2015-10-19 | Disposition: A | Discharge status: Home / Self Care | Payer: BLUE CROSS/BLUE SHIELD | Source: Ambulatory Visit | Attending: Obstetrics and Gynecology | Admitting: Obstetrics and Gynecology

## 2015-10-19 ENCOUNTER — Other Ambulatory Visit: Payer: Self-pay | Admitting: Obstetrics & Gynecology

## 2015-10-19 DIAGNOSIS — Z01419 Encounter for gynecological examination (general) (routine) without abnormal findings: Secondary | ICD-10-CM | POA: Diagnosis not present

## 2015-10-19 DIAGNOSIS — Z113 Encounter for screening for infections with a predominantly sexual mode of transmission: Secondary | ICD-10-CM | POA: Insufficient documentation

## 2015-10-19 DIAGNOSIS — Z1151 Encounter for screening for human papillomavirus (HPV): Secondary | ICD-10-CM | POA: Insufficient documentation

## 2015-10-22 LAB — CYTOLOGY - PAP

## 2015-10-29 DIAGNOSIS — F419 Anxiety disorder, unspecified: Secondary | ICD-10-CM | POA: Diagnosis not present

## 2015-11-13 DIAGNOSIS — R002 Palpitations: Secondary | ICD-10-CM | POA: Diagnosis not present

## 2016-07-14 IMAGING — CR DG CHEST 2V
2 series · 2 of 2 positions shown · non-contrast
Comparison: 05/20/2009

CLINICAL DATA: Left-sided chest pain

EXAM:
CHEST  2 VIEW

[w chest pa]
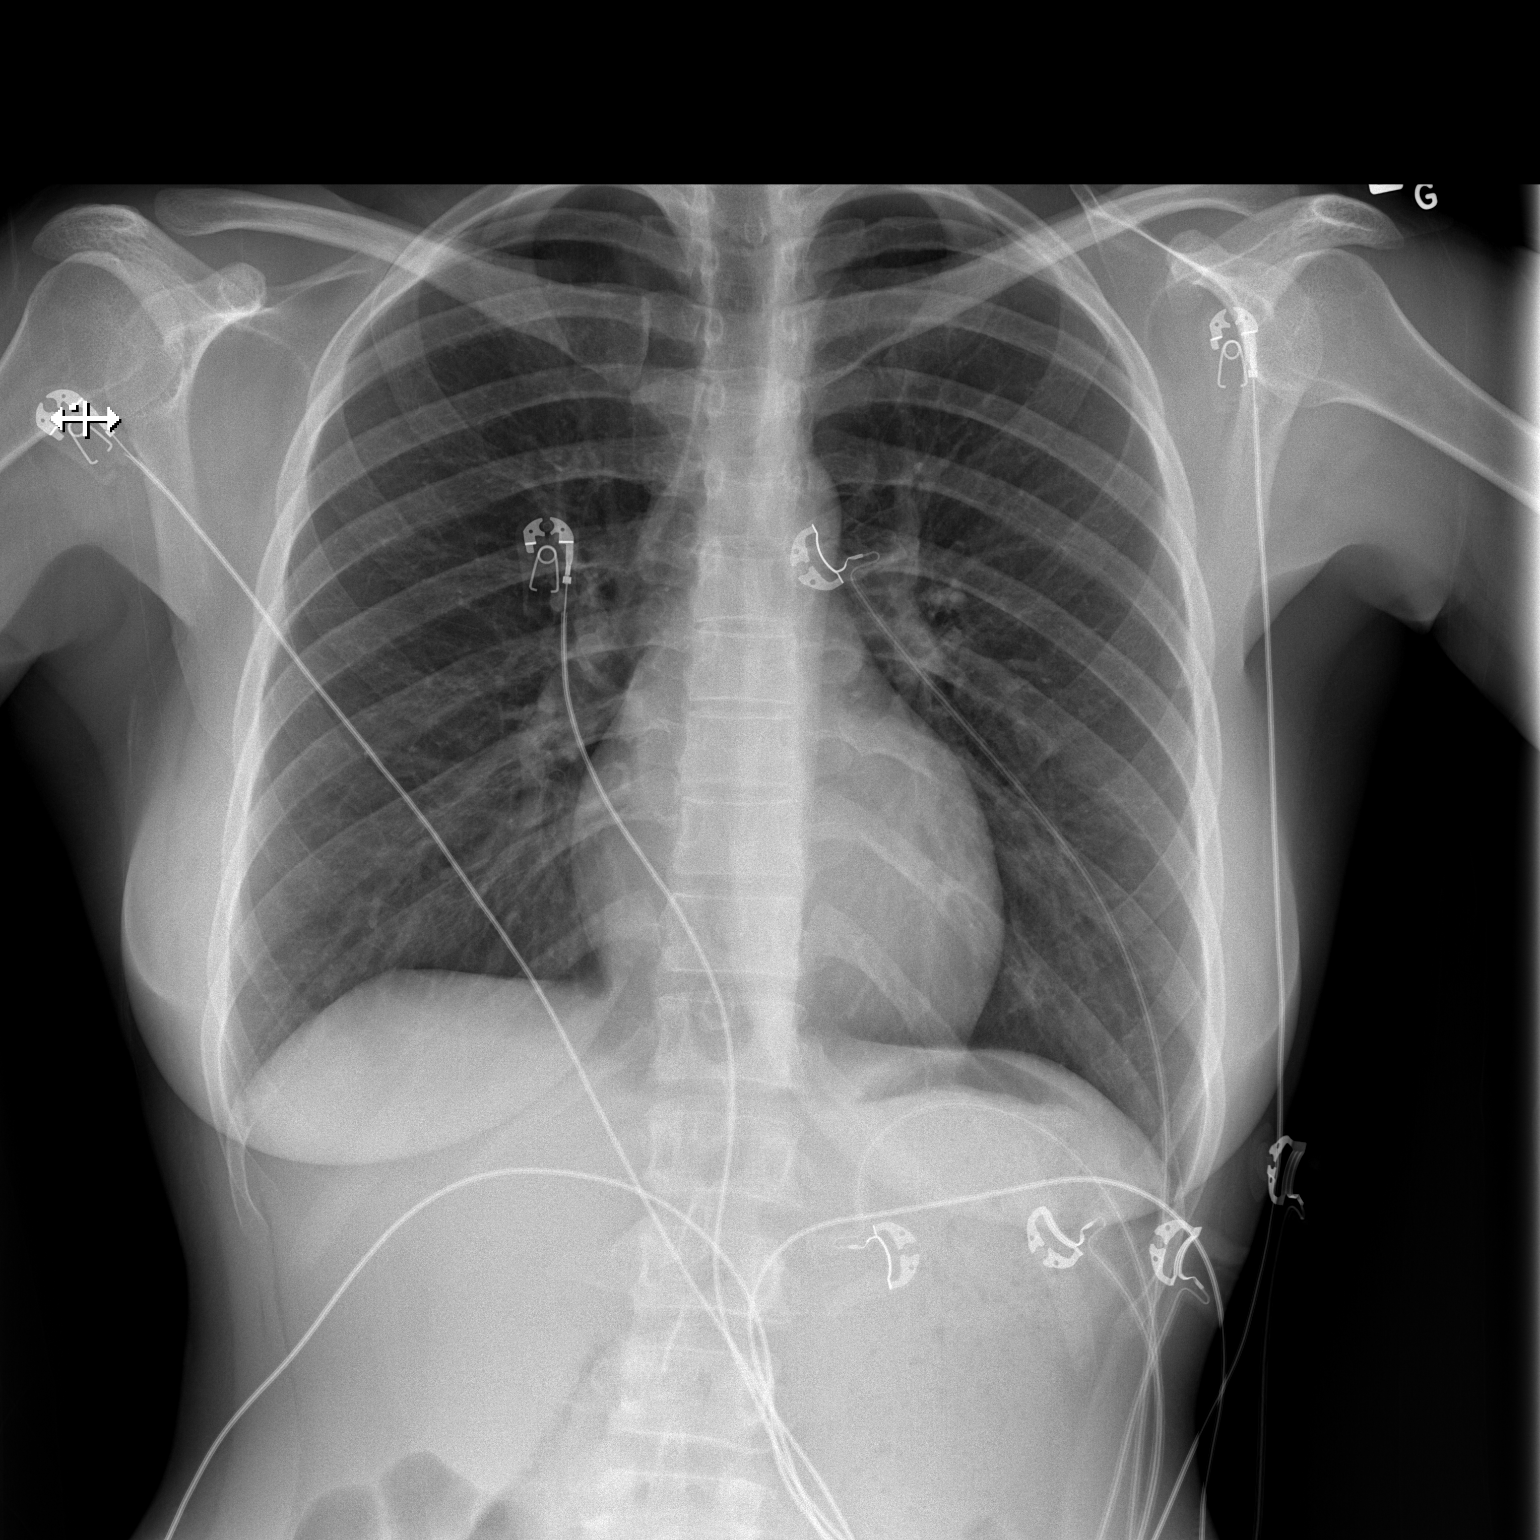

[w chest lat]
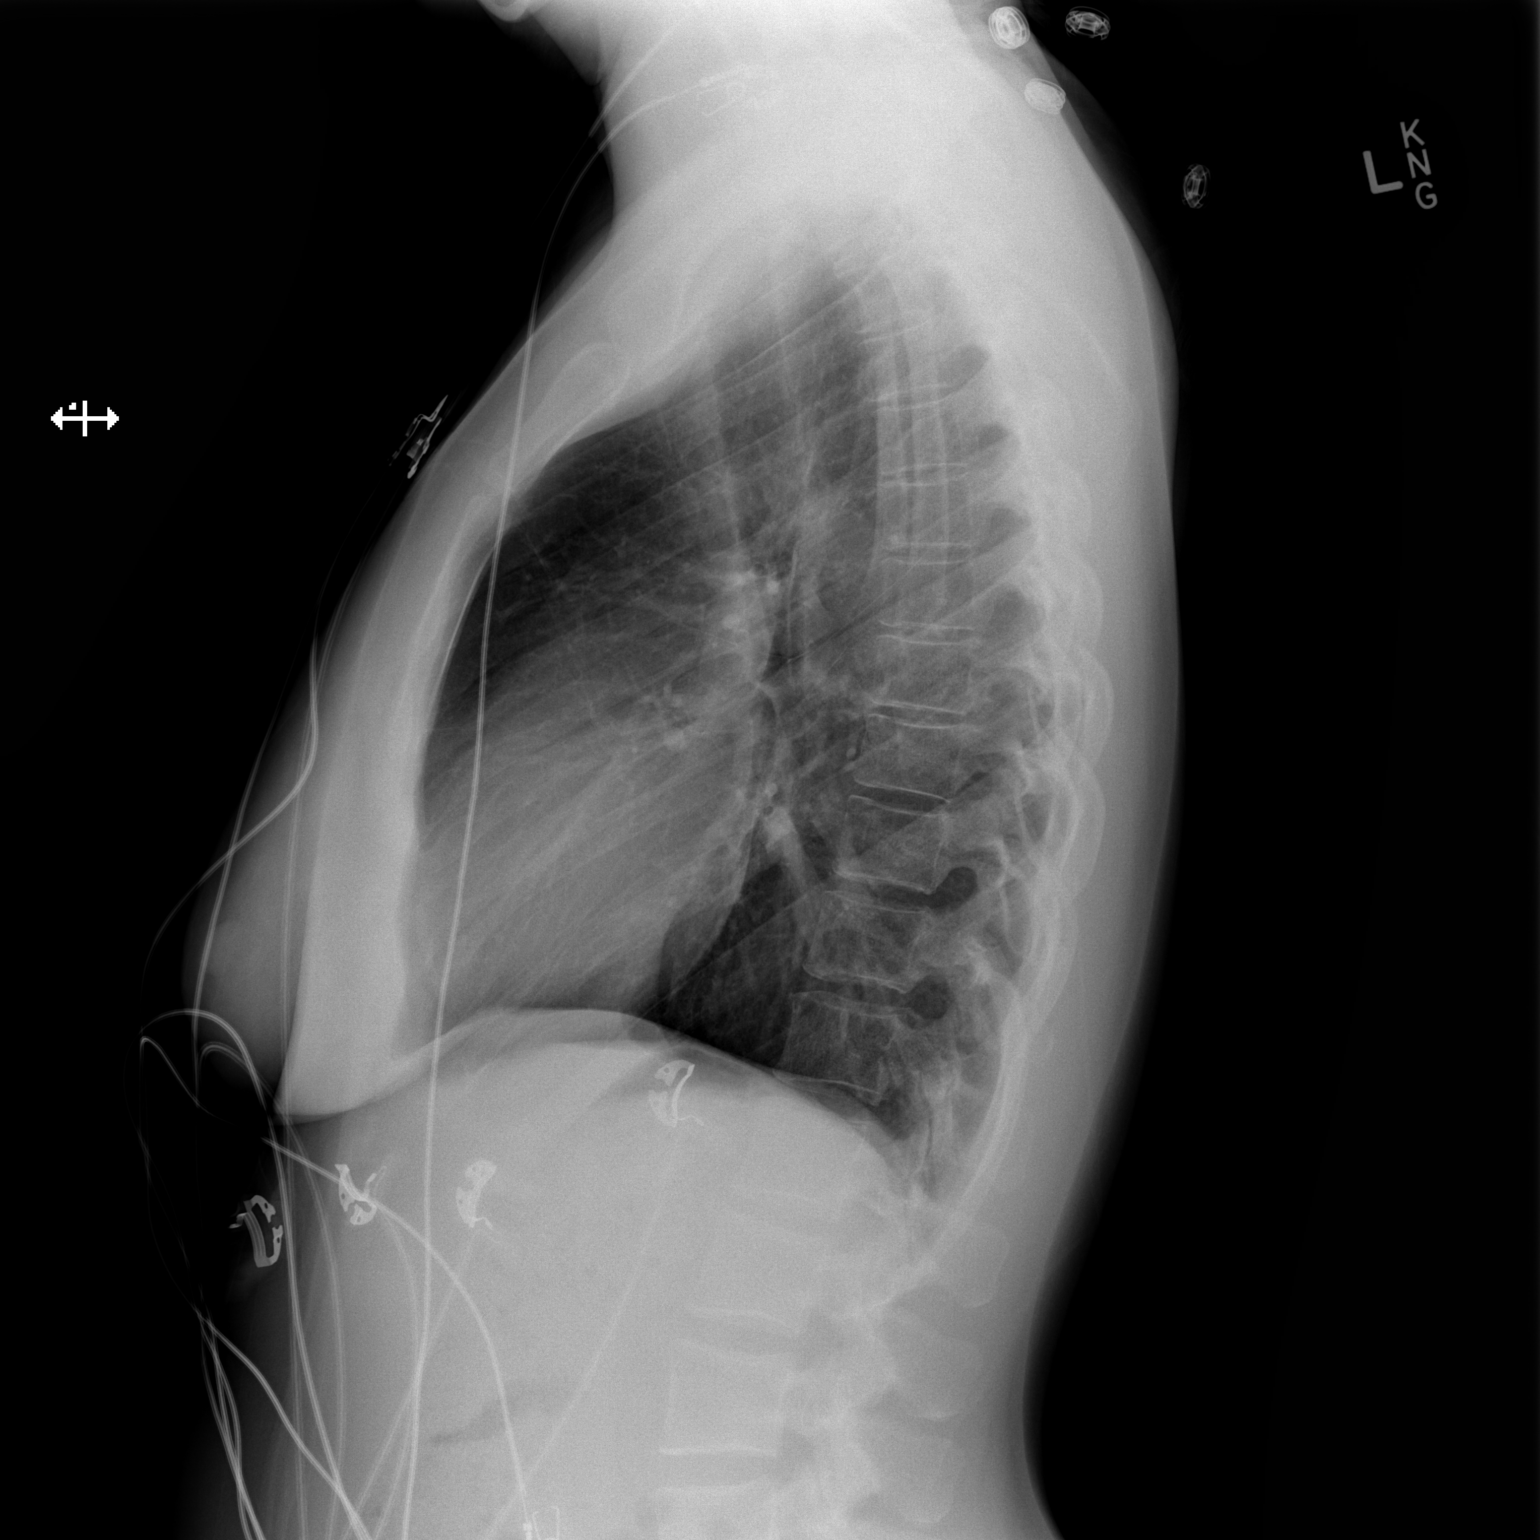

[2 of 2 positions shown; findings below may reference images not displayed]

FINDINGS: The heart size and mediastinal contours are within normal limits.
Both lungs are clear. The visualized skeletal structures are
unremarkable.
IMPRESSION: No active cardiopulmonary disease.

## 2016-08-21 DIAGNOSIS — M79675 Pain in left toe(s): Secondary | ICD-10-CM | POA: Diagnosis not present

## 2016-09-04 ENCOUNTER — Ambulatory Visit (INDEPENDENT_AMBULATORY_CARE_PROVIDER_SITE_OTHER): Payer: BLUE CROSS/BLUE SHIELD | Admitting: Podiatry

## 2016-09-04 ENCOUNTER — Ambulatory Visit (INDEPENDENT_AMBULATORY_CARE_PROVIDER_SITE_OTHER): Payer: BLUE CROSS/BLUE SHIELD

## 2016-09-04 ENCOUNTER — Encounter: Payer: Self-pay | Admitting: Podiatry

## 2016-09-04 DIAGNOSIS — M779 Enthesopathy, unspecified: Secondary | ICD-10-CM

## 2016-09-04 DIAGNOSIS — M778 Other enthesopathies, not elsewhere classified: Secondary | ICD-10-CM

## 2016-09-04 DIAGNOSIS — M2012 Hallux valgus (acquired), left foot: Secondary | ICD-10-CM | POA: Diagnosis not present

## 2016-09-04 DIAGNOSIS — M7752 Other enthesopathy of left foot: Secondary | ICD-10-CM

## 2016-09-04 NOTE — Progress Notes (Signed)
   Subjective:    Patient ID: Stacey Berg, female    DOB: Jun 20, 1985, 31 y.o.   MRN: [id number]  HPI: She presents today with chief complaint of first metatarsophalangeal joint left knee. She states that she injured it while she was walking up stairs by severely dorsiflexing the toe. She's had previous bunion surgery on the right foot but the left foot is developing a bunion as well and now is red and sore. States that she wears toed boots at work and it hurts while working.    Review of Systems  All other systems reviewed and are negative.      Objective:   Physical Exam: Vital signs are stable alert and oriented 3 pulses are palpable. Neurologic system is intact. Deep tendon reflexes are intact. Muscle strength +5 over 5 dorsiflexion plantar flexors and inverters everters all of his musculature is intact. Orthopedic evaluation and x-rays all joints distal to the ankle for range of motion or crepitation. 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 Radiographs today do demonstrate an increased first metatarsal angle and hallux abductus angle soft tissue increase in density along the medial aspect of the first metatarsophalangeal joint left foot. No open lesions or wounds are noted.      Assessment & Plan:  Capsulitis hallux valgus deformity left foot.  Plan: Discussed etiology pathology concerned versus surgical therapies. I injected the area today with dexamethasone and local anesthetic I will follow-up with her in 1 month.

## 2016-10-02 ENCOUNTER — Ambulatory Visit: Payer: BLUE CROSS/BLUE SHIELD | Admitting: Podiatry

## 2016-10-09 ENCOUNTER — Ambulatory Visit (INDEPENDENT_AMBULATORY_CARE_PROVIDER_SITE_OTHER): Payer: BLUE CROSS/BLUE SHIELD | Admitting: Podiatry

## 2016-10-09 ENCOUNTER — Encounter: Payer: Self-pay | Admitting: Podiatry

## 2016-10-09 DIAGNOSIS — M7752 Other enthesopathy of left foot: Secondary | ICD-10-CM

## 2016-10-09 DIAGNOSIS — M778 Other enthesopathies, not elsewhere classified: Secondary | ICD-10-CM

## 2016-10-09 DIAGNOSIS — M2012 Hallux valgus (acquired), left foot: Secondary | ICD-10-CM

## 2016-10-09 DIAGNOSIS — M779 Enthesopathy, unspecified: Secondary | ICD-10-CM

## 2016-10-09 MED ORDER — CELECOXIB 200 MG PO CAPS: 200.0000 mg | ORAL_CAPSULE | Freq: Two times a day (BID) | ORAL | 3 refills | Status: DC

## 2016-10-09 NOTE — Progress Notes (Signed)
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 She states it is doing a little bit better as she refers to about 70%.  Objective: House abductovalgus deformity left foot. Mildly tender on palpation today but not nearly as swollen and painful as it was previous visit.  Assessment: Capsulitis resolving 70%.  Plan: I instructed her to follow up with me in the fall of proximal Lasix weeks prior to time for surgery. The consent form consisting [place] bunionectomy with a long-arm left foot.

## 2017-02-26 DIAGNOSIS — Z720 Tobacco use: Secondary | ICD-10-CM | POA: Diagnosis not present

## 2017-02-26 DIAGNOSIS — Z3045 Encounter for surveillance of transdermal patch hormonal contraceptive device: Secondary | ICD-10-CM | POA: Diagnosis not present

## 2017-08-03 DIAGNOSIS — M76829 Posterior tibial tendinitis, unspecified leg: Secondary | ICD-10-CM | POA: Diagnosis not present

## 2017-08-03 DIAGNOSIS — M79671 Pain in right foot: Secondary | ICD-10-CM | POA: Diagnosis not present

## 2017-08-03 DIAGNOSIS — M25571 Pain in right ankle and joints of right foot: Secondary | ICD-10-CM | POA: Diagnosis not present

## 2017-08-13 DIAGNOSIS — M25571 Pain in right ankle and joints of right foot: Secondary | ICD-10-CM | POA: Diagnosis not present

## 2017-09-14 DIAGNOSIS — M79671 Pain in right foot: Secondary | ICD-10-CM | POA: Diagnosis not present

## 2017-09-14 DIAGNOSIS — M76829 Posterior tibial tendinitis, unspecified leg: Secondary | ICD-10-CM | POA: Diagnosis not present

## 2017-09-14 DIAGNOSIS — M25571 Pain in right ankle and joints of right foot: Secondary | ICD-10-CM | POA: Diagnosis not present

## 2017-09-21 DIAGNOSIS — M25571 Pain in right ankle and joints of right foot: Secondary | ICD-10-CM | POA: Diagnosis not present

## 2017-10-07 DIAGNOSIS — M25571 Pain in right ankle and joints of right foot: Secondary | ICD-10-CM | POA: Diagnosis not present

## 2017-10-07 DIAGNOSIS — M76821 Posterior tibial tendinitis, right leg: Secondary | ICD-10-CM | POA: Diagnosis not present

## 2017-10-26 DIAGNOSIS — M2011 Hallux valgus (acquired), right foot: Secondary | ICD-10-CM | POA: Diagnosis not present

## 2017-10-26 DIAGNOSIS — M76821 Posterior tibial tendinitis, right leg: Secondary | ICD-10-CM | POA: Diagnosis not present

## 2017-10-26 DIAGNOSIS — M79671 Pain in right foot: Secondary | ICD-10-CM | POA: Diagnosis not present

## 2017-11-23 DIAGNOSIS — M25571 Pain in right ankle and joints of right foot: Secondary | ICD-10-CM | POA: Diagnosis not present

## 2017-11-23 DIAGNOSIS — M76821 Posterior tibial tendinitis, right leg: Secondary | ICD-10-CM | POA: Diagnosis not present

## 2017-11-23 DIAGNOSIS — M6701 Short Achilles tendon (acquired), right ankle: Secondary | ICD-10-CM | POA: Diagnosis not present

## 2017-12-22 ENCOUNTER — Ambulatory Visit (INDEPENDENT_AMBULATORY_CARE_PROVIDER_SITE_OTHER): Payer: BLUE CROSS/BLUE SHIELD

## 2017-12-22 ENCOUNTER — Ambulatory Visit: Payer: BLUE CROSS/BLUE SHIELD | Admitting: Podiatry

## 2017-12-22 DIAGNOSIS — M2011 Hallux valgus (acquired), right foot: Secondary | ICD-10-CM

## 2017-12-22 DIAGNOSIS — M2012 Hallux valgus (acquired), left foot: Secondary | ICD-10-CM

## 2017-12-22 NOTE — Progress Notes (Signed)
She presents today states that my right foot is giving me a lot of grief I would like to consider surgical intervention on the right foot but I seen Dr. Victorino DikeHewitt who suggested that I have lot more surgery on my foot and I feel like I need.  She would like for me to take a look at the MRI and the MRI over read.  Objective: Vital signs are stable she is alert and oriented x3.  Pulses are palpable.  She has pain on palpation of the first metatarsophalangeal joint right over left though the right foot has been corrected before it is still more painful due to the nerve overlying reactive hypertrophic medial condyle of the head of the first metatarsal right foot.  Left foot does demonstrate significant bunion deformity and hallux interphalangeal.  Ambulatory evaluation today does demonstrate pes planus though she has no pain on palpation of the posterior tibial tendon.  Radiographs demonstrate mild pes planus hallux valgus deformity of the right foot been surgically corrected before.  She also had an aching osteotomy performed to the right hallux.  Left foot does demonstrate worse bunion deformity with increase in the first intermetatarsal angle greater than normal value hallux abductus angle greater than normal value.  Hallux interphalangeus is also present.  Assessment pes planus hallux valgus more painful on the right than on the left  Plan: She will follow-up with me in a couple of weeks when she brings me the MRI read and will allow me to read the MRI with the explanation.  We will discuss surgery and what is necessary what is not necessary.

## 2018-01-12 ENCOUNTER — Ambulatory Visit: Payer: BLUE CROSS/BLUE SHIELD | Admitting: Podiatry

## 2018-01-26 ENCOUNTER — Ambulatory Visit: Payer: BLUE CROSS/BLUE SHIELD | Admitting: Podiatry

## 2018-02-04 ENCOUNTER — Encounter (INDEPENDENT_AMBULATORY_CARE_PROVIDER_SITE_OTHER): Payer: BLUE CROSS/BLUE SHIELD | Admitting: Podiatry

## 2018-02-04 NOTE — Progress Notes (Signed)
This encounter was created in error - please disregard.

## 2018-05-05 NOTE — L&D Delivery Note (Signed)
Delivery Note At 11:34 AM a viable female was delivered via Vaginal, Spontaneous (Presentation:OA ).  APGAR: 9, 9; weight pending  .   Placenta status: L&D.  Cord:  with the following complications: none .  Cord pH: n/a  Anesthesia:   Episiotomy: None Lacerations: 1st degree Suture Repair: 3.0 vicryl Est. Blood Loss (mL): 100  Mom to postpartum.  Baby to Couplet care / Skin to Skin.  Stacey Berg 03/28/2019, 12:23 PM

## 2018-06-19 DIAGNOSIS — R1030 Lower abdominal pain, unspecified: Secondary | ICD-10-CM | POA: Diagnosis not present

## 2018-06-21 DIAGNOSIS — Z0000 Encounter for general adult medical examination without abnormal findings: Secondary | ICD-10-CM | POA: Diagnosis not present

## 2018-06-21 DIAGNOSIS — Z5181 Encounter for therapeutic drug level monitoring: Secondary | ICD-10-CM | POA: Diagnosis not present

## 2018-06-21 DIAGNOSIS — Z136 Encounter for screening for cardiovascular disorders: Secondary | ICD-10-CM | POA: Diagnosis not present

## 2018-07-07 DIAGNOSIS — M25562 Pain in left knee: Secondary | ICD-10-CM | POA: Diagnosis not present

## 2018-07-23 DIAGNOSIS — Z113 Encounter for screening for infections with a predominantly sexual mode of transmission: Secondary | ICD-10-CM | POA: Diagnosis not present

## 2018-07-23 DIAGNOSIS — Z01419 Encounter for gynecological examination (general) (routine) without abnormal findings: Secondary | ICD-10-CM | POA: Diagnosis not present

## 2018-08-27 DIAGNOSIS — Z3480 Encounter for supervision of other normal pregnancy, unspecified trimester: Secondary | ICD-10-CM | POA: Diagnosis not present

## 2018-08-27 DIAGNOSIS — Z3201 Encounter for pregnancy test, result positive: Secondary | ICD-10-CM | POA: Diagnosis not present

## 2018-09-02 DIAGNOSIS — O09521 Supervision of elderly multigravida, first trimester: Secondary | ICD-10-CM | POA: Diagnosis not present

## 2018-09-02 DIAGNOSIS — O26891 Other specified pregnancy related conditions, first trimester: Secondary | ICD-10-CM | POA: Diagnosis not present

## 2018-09-21 DIAGNOSIS — O09521 Supervision of elderly multigravida, first trimester: Secondary | ICD-10-CM | POA: Diagnosis not present

## 2018-09-28 DIAGNOSIS — Z3480 Encounter for supervision of other normal pregnancy, unspecified trimester: Secondary | ICD-10-CM | POA: Diagnosis not present

## 2018-10-03 ENCOUNTER — Other Ambulatory Visit: Payer: Self-pay

## 2018-10-03 ENCOUNTER — Inpatient Hospital Stay (HOSPITAL_COMMUNITY): Payer: BLUE CROSS/BLUE SHIELD

## 2018-10-03 ENCOUNTER — Inpatient Hospital Stay (HOSPITAL_COMMUNITY)
Admission: EM | Admit: 2018-10-03 | Discharge: 2018-10-03 | Disposition: A | Discharge status: Home / Self Care | Payer: BLUE CROSS/BLUE SHIELD | Source: Ambulatory Visit | Attending: Obstetrics & Gynecology | Admitting: Obstetrics & Gynecology

## 2018-10-03 ENCOUNTER — Encounter (HOSPITAL_COMMUNITY): Payer: Self-pay

## 2018-10-03 DIAGNOSIS — O99331 Smoking (tobacco) complicating pregnancy, first trimester: Secondary | ICD-10-CM | POA: Diagnosis not present

## 2018-10-03 DIAGNOSIS — F17210 Nicotine dependence, cigarettes, uncomplicated: Secondary | ICD-10-CM | POA: Insufficient documentation

## 2018-10-03 DIAGNOSIS — O468X1 Other antepartum hemorrhage, first trimester: Secondary | ICD-10-CM | POA: Diagnosis not present

## 2018-10-03 DIAGNOSIS — O418X10 Other specified disorders of amniotic fluid and membranes, first trimester, not applicable or unspecified: Secondary | ICD-10-CM

## 2018-10-03 DIAGNOSIS — Z3A11 11 weeks gestation of pregnancy: Secondary | ICD-10-CM | POA: Diagnosis not present

## 2018-10-03 DIAGNOSIS — Z6790 Unspecified blood type, Rh positive: Secondary | ICD-10-CM

## 2018-10-03 DIAGNOSIS — O208 Other hemorrhage in early pregnancy: Secondary | ICD-10-CM | POA: Diagnosis not present

## 2018-10-03 DIAGNOSIS — Z3A12 12 weeks gestation of pregnancy: Secondary | ICD-10-CM | POA: Insufficient documentation

## 2018-10-03 DIAGNOSIS — O209 Hemorrhage in early pregnancy, unspecified: Secondary | ICD-10-CM | POA: Diagnosis not present

## 2018-10-03 DIAGNOSIS — O26851 Spotting complicating pregnancy, first trimester: Secondary | ICD-10-CM | POA: Diagnosis not present

## 2018-10-03 DIAGNOSIS — Z79899 Other long term (current) drug therapy: Secondary | ICD-10-CM | POA: Insufficient documentation

## 2018-10-03 DIAGNOSIS — Z3491 Encounter for supervision of normal pregnancy, unspecified, first trimester: Secondary | ICD-10-CM

## 2018-10-03 LAB — URINALYSIS, ROUTINE W REFLEX MICROSCOPIC
Bacteria, UA: NONE SEEN
Bilirubin Urine: NEGATIVE
Glucose, UA: NEGATIVE mg/dL
Ketones, ur: NEGATIVE mg/dL
Leukocytes,Ua: NEGATIVE
Nitrite: NEGATIVE
Protein, ur: NEGATIVE mg/dL
Specific Gravity, Urine: 1.003 — ABNORMAL LOW (ref 1.005–1.030)
pH: 6 (ref 5.0–8.0)

## 2018-10-03 LAB — WET PREP, GENITAL
Clue Cells Wet Prep HPF POC: NONE SEEN
Sperm: NONE SEEN
Trich, Wet Prep: NONE SEEN
Yeast Wet Prep HPF POC: NONE SEEN

## 2018-10-03 LAB — CBC
HCT: 32.4 % — ABNORMAL LOW (ref 36.0–46.0)
Hemoglobin: 10.8 g/dL — ABNORMAL LOW (ref 12.0–15.0)
MCH: 24.2 pg — ABNORMAL LOW (ref 26.0–34.0)
MCHC: 33.3 g/dL (ref 30.0–36.0)
MCV: 72.6 fL — ABNORMAL LOW (ref 80.0–100.0)
Platelets: 291 10*3/uL (ref 150–400)
RBC: 4.46 MIL/uL (ref 3.87–5.11)
RDW: 13.1 % (ref 11.5–15.5)
WBC: 10.6 10*3/uL — ABNORMAL HIGH (ref 4.0–10.5)
nRBC: 0 % (ref 0.0–0.2)

## 2018-10-03 NOTE — MAU Note (Signed)
Pt reports she was laying in bed and rolled over and felt like her underwear was wet. States she went to the bathroom and noticed it was blood. States that it did leak on her sheets. Did not notice any clots or tissue. Pt reports she is [redacted] weeks pregnant. Pt denies pain. LMP: around 07/25/18

## 2018-10-03 NOTE — MAU Provider Note (Addendum)
Chief Complaint: Vaginal Bleeding   None     SUBJECTIVE HPI: Stacey Berg is a 33 y.o. G2P1001 at [redacted]w[redacted]d by LMP who presents to maternity admissions reporting onset of bright red vaginal bleeding this morning.  She reports she was sleeping and felt wet. She went to the bathroom and saw the bleeding, enough to wet her underwear and fill the toilet. She did not see any clots or tissue.  She did not wear a pad to MAU and reports seeing light red in her underwear but they are not soaked.  There is no pain. Ultrasound in the office at 8 weeks was normal.  She denies recent intercourse or strenuous activity.  She had itching, vaginal discharge several days ago, none today.  HPI  Past Medical History:  Diagnosis Date  . No pertinent past medical history    Past Surgical History:  Procedure Laterality Date  . FOOT SURGERY     Social History   Socioeconomic History  . Marital status: Single    Spouse name: Not on file  . Number of children: Not on file  . Years of education: Not on file  . Highest education level: Not on file  Occupational History  . Not on file  Social Needs  . Financial resource strain: Not on file  . Food insecurity:    Worry: Not on file    Inability: Not on file  . Transportation needs:    Medical: Not on file    Non-medical: Not on file  Tobacco Use  . Smoking status: Current Some Day Smoker    Types: Cigarettes  . Smokeless tobacco: Never Used  Substance and Sexual Activity  . Alcohol use: No  . Drug use: No  . Sexual activity: Yes    Birth control/protection: None  Lifestyle  . Physical activity:    Days per week: Not on file    Minutes per session: Not on file  . Stress: Not on file  Relationships  . Social connections:    Talks on phone: Not on file    Gets together: Not on file    Attends religious service: Not on file    Active member of club or organization: Not on file    Attends meetings of clubs or organizations: Not on file   Relationship status: Not on file  . Intimate partner violence:    Fear of current or ex partner: Not on file    Emotionally abused: Not on file    Physically abused: Not on file    Forced sexual activity: Not on file  Other Topics Concern  . Not on file  Social History Narrative  . Not on file   No current facility-administered medications on file prior to encounter.    Current Outpatient Medications on File Prior to Encounter  Medication Sig Dispense Refill  . ALPRAZolam (XANAX) 0.5 MG tablet     . celecoxib (CELEBREX) 200 MG capsule Take 1 capsule (200 mg total) by mouth 2 (two) times daily. 60 capsule 3  . diclofenac sodium (VOLTAREN) 1 % GEL diclofenac 1 % topical gel     No Known Allergies  ROS:  Review of Systems  Constitutional: Negative for chills, fatigue and fever.  HENT: Negative for sinus pressure.   Eyes: Negative for photophobia.  Respiratory: Negative for shortness of breath.   Cardiovascular: Negative for chest pain.  Gastrointestinal: Negative for constipation, diarrhea, nausea and vomiting.  Genitourinary: Positive for vaginal bleeding. Negative for difficulty urinating,  dysuria, flank pain, frequency, pelvic pain, vaginal discharge and vaginal pain.  Musculoskeletal: Negative for neck pain.  Neurological: Negative for dizziness, weakness and headaches.  Psychiatric/Behavioral: Negative.      I have reviewed patient's Past Medical Hx, Surgical Hx, Family Hx, Social Hx, medications and allergies.   Physical Exam   Patient Vitals for the past 24 hrs:  BP Temp Temp src Pulse Resp SpO2 Height Weight  10/03/18 0346 135/83 - - 91 18 - - -  10/03/18 0333 126/75 98.2 F (36.8 C) Oral 92 16 100 % 5\' 4"  (1.626 m) 61.6 kg   Constitutional: Well-developed, well-nourished female in no acute distress.  Cardiovascular: normal rate Respiratory: normal effort GI: Abd soft, non-tender. Pos BS x 4 MS: Extremities nontender, no edema, normal ROM Neurologic: Alert and  oriented x 4.  GU: Neg CVAT.  PELVIC EXAM: Cervix pink, visually closed, without lesion, scant pink/light red discharge, no foxswab used to visualize cervix, vaginal walls and external genitalia normal Bimanual exam: Cervix 0/long/high, firm, anterior, neg CMT, uterus nontender, ~ 11 week size, adnexa without tenderness, enlargement, or mass  FHT 161 by doppler  LAB RESULTS Results for orders placed or performed during the hospital encounter of 10/03/18 (from the past 24 hour(s))  Urinalysis, Routine w reflex microscopic     Status: Abnormal   Collection Time: 10/03/18  3:56 AM  Result Value Ref Range   Color, Urine YELLOW YELLOW   APPearance HAZY (A) CLEAR   Specific Gravity, Urine 1.003 (L) 1.005 - 1.030   pH 6.0 5.0 - 8.0   Glucose, UA NEGATIVE NEGATIVE mg/dL   Hgb urine dipstick MODERATE (A) NEGATIVE   Bilirubin Urine NEGATIVE NEGATIVE   Ketones, ur NEGATIVE NEGATIVE mg/dL   Protein, ur NEGATIVE NEGATIVE mg/dL   Nitrite NEGATIVE NEGATIVE   Leukocytes,Ua NEGATIVE NEGATIVE   RBC / HPF 0-5 0 - 5 RBC/hpf   WBC, UA 0-5 0 - 5 WBC/hpf   Bacteria, UA NONE SEEN NONE SEEN  CBC     Status: Abnormal   Collection Time: 10/03/18  4:22 AM  Result Value Ref Range   WBC 10.6 (H) 4.0 - 10.5 K/uL   RBC 4.46 3.87 - 5.11 MIL/uL   Hemoglobin 10.8 (L) 12.0 - 15.0 g/dL   HCT 16.132.4 (L) 09.636.0 - 04.546.0 %   MCV 72.6 (L) 80.0 - 100.0 fL   MCH 24.2 (L) 26.0 - 34.0 pg   MCHC 33.3 30.0 - 36.0 g/dL   RDW 40.913.1 81.111.5 - 91.415.5 %   Platelets 291 150 - 400 K/uL   nRBC 0.0 0.0 - 0.2 %  Wet prep, genital     Status: Abnormal   Collection Time: 10/03/18  4:58 AM  Result Value Ref Range   Yeast Wet Prep HPF POC NONE SEEN NONE SEEN   Trich, Wet Prep NONE SEEN NONE SEEN   Clue Cells Wet Prep HPF POC NONE SEEN NONE SEEN   WBC, Wet Prep HPF POC MANY (A) NONE SEEN   Sperm NONE SEEN        IMAGING [place] Ob Comp Less 14 Wks  Result Date: 10/03/2018 CLINICAL DATA:  Pregnant, vaginal bleeding EXAM: OBSTETRIC <14 WK  ULTRASOUND TECHNIQUE: Transabdominal ultrasound was performed for evaluation of the gestation as well as the maternal uterus and adnexal regions. COMPARISON:  None. FINDINGS: Intrauterine gestational sac: Single Yolk sac:  Visualized. Embryo:  Visualized. Cardiac Activity: Visualized. Heart Rate: 162 bpm CRL:   60.4 mm   12 w 4 d  [place] EDC: 04/13/2019 Subchorionic hemorrhage:  Small subchronic hemorrhage. Maternal uterus/adnexae: Right ovary is within normal limits. Left ovary is not discretely visualized. No free fluid. IMPRESSION: Single live intrauterine gestation, with estimated gestational age [redacted] weeks 4 days by crown-rump length, as above. Electronically Signed   By: Charline Bills M.D.   On: 10/03/2018 05:35    MAU Management/MDM: Orders Placed This Encounter  Procedures  . Wet prep, genital  . US OB Comp Less 14 Wks  . Urinalysis, Routine w reflex microscopic  . CBC  . Discharge patient    No orders of the defined types were placed in this encounter.   [place] confirms viable IUP and indicates a small SCH, the likely source of bleeding. Reviewed good prognosis with first trimester small [facility] with pt. Warning signs/bleeding precautions reviewed. Pt to f/u in office 6/16 as scheduled.  Pt discharged with strict return precautions.  ASSESSMENT 1. Normal IUP (intrauterine pregnancy) on prenatal ultrasound, first trimester   2. Vaginal bleeding in pregnancy, first trimester   3. Subchorionic hematoma in first trimester, single or unspecified fetus   4. Blood type, Rh positive     PLAN Discharge home Allergies as of 10/03/2018   No Known Allergies     Medication List    STOP taking these medications   ALPRAZolam 0.5 MG tablet Commonly known as:  XANAX   celecoxib 200 MG capsule Commonly known as:  CeleBREX   diclofenac sodium 1 % Gel Commonly known as:  VOLTAREN      Follow-up Information    Gynecology, [facility].   Specialty:  Obstetrics  and Gynecology Why:  As scheduled, return to MAU as needed for emergencies Contact information: [address] [place] [address] [phone]           Sharen Counter Certified Nurse-Midwife 10/03/2018  6:06 AM

## 2018-10-04 LAB — GC/CHLAMYDIA PROBE AMP (~~LOC~~) NOT AT ARMC
Chlamydia: NEGATIVE
Neisseria Gonorrhea: NEGATIVE

## 2018-11-10 DIAGNOSIS — L218 Other seborrheic dermatitis: Secondary | ICD-10-CM | POA: Diagnosis not present

## 2018-11-15 DIAGNOSIS — Z3480 Encounter for supervision of other normal pregnancy, unspecified trimester: Secondary | ICD-10-CM | POA: Diagnosis not present

## 2018-11-15 DIAGNOSIS — Z3482 Encounter for supervision of other normal pregnancy, second trimester: Secondary | ICD-10-CM | POA: Diagnosis not present

## 2018-11-29 DIAGNOSIS — O09522 Supervision of elderly multigravida, second trimester: Secondary | ICD-10-CM | POA: Diagnosis not present

## 2018-11-29 DIAGNOSIS — Z36 Encounter for antenatal screening for chromosomal anomalies: Secondary | ICD-10-CM | POA: Diagnosis not present

## 2018-11-29 DIAGNOSIS — Z3482 Encounter for supervision of other normal pregnancy, second trimester: Secondary | ICD-10-CM | POA: Diagnosis not present

## 2018-12-03 DIAGNOSIS — M5489 Other dorsalgia: Secondary | ICD-10-CM | POA: Diagnosis not present

## 2018-12-10 ENCOUNTER — Other Ambulatory Visit: Payer: Self-pay

## 2018-12-10 DIAGNOSIS — Z20822 Contact with and (suspected) exposure to covid-19: Secondary | ICD-10-CM

## 2018-12-11 LAB — NOVEL CORONAVIRUS, NAA: SARS-CoV-2, NAA: NOT DETECTED

## 2018-12-17 ENCOUNTER — Other Ambulatory Visit: Payer: Self-pay | Admitting: *Deleted

## 2018-12-17 DIAGNOSIS — Z20822 Contact with and (suspected) exposure to covid-19: Secondary | ICD-10-CM

## 2018-12-19 LAB — NOVEL CORONAVIRUS, NAA: SARS-CoV-2, NAA: NOT DETECTED

## 2019-01-14 DIAGNOSIS — Z3482 Encounter for supervision of other normal pregnancy, second trimester: Secondary | ICD-10-CM | POA: Diagnosis not present

## 2019-01-17 DIAGNOSIS — Z3482 Encounter for supervision of other normal pregnancy, second trimester: Secondary | ICD-10-CM | POA: Diagnosis not present

## 2019-01-19 ENCOUNTER — Other Ambulatory Visit: Payer: Self-pay

## 2019-01-19 ENCOUNTER — Encounter: Payer: BC Managed Care – PPO | Attending: Obstetrics & Gynecology | Admitting: Registered"

## 2019-01-19 ENCOUNTER — Encounter: Payer: Self-pay | Admitting: Registered"

## 2019-01-19 DIAGNOSIS — O99810 Abnormal glucose complicating pregnancy: Secondary | ICD-10-CM | POA: Insufficient documentation

## 2019-01-19 NOTE — Progress Notes (Signed)
Patient was seen on 01/19/2019 for Gestational Diabetes self-management class at the [facility]. The following learning objectives were met by the patient during this course:   States the definition of Gestational Diabetes  States why dietary management is important in controlling blood glucose  Describes the effects each nutrient has on blood glucose levels  Demonstrates ability to create a balanced meal plan  Demonstrates carbohydrate counting   States when to check blood glucose levels  Demonstrates proper blood glucose monitoring techniques  States the effect of stress and exercise on blood glucose levels  States the importance of limiting caffeine and abstaining from alcohol and smoking  Blood glucose monitor given: Accu-chek Guide Me Lot # [id number] Exp: 01/27/20 Blood glucose reading: 97 mg/dL  Patient instructed to monitor glucose levels: FBS: 60 - <95; 1 hour: <140; 2 hour: <120  Patient received handouts:  Nutrition Diabetes and Pregnancy, including carb counting list  Patient will be seen for follow-up as needed.

## 2019-01-20 DIAGNOSIS — O9989 Other specified diseases and conditions complicating pregnancy, childbirth and the puerperium: Secondary | ICD-10-CM | POA: Diagnosis not present

## 2019-02-07 DIAGNOSIS — O24410 Gestational diabetes mellitus in pregnancy, diet controlled: Secondary | ICD-10-CM | POA: Diagnosis not present

## 2019-02-07 DIAGNOSIS — Z23 Encounter for immunization: Secondary | ICD-10-CM | POA: Diagnosis not present

## 2019-02-10 ENCOUNTER — Encounter (HOSPITAL_COMMUNITY): Payer: Self-pay

## 2019-02-10 ENCOUNTER — Inpatient Hospital Stay (HOSPITAL_COMMUNITY)
Admission: AD | Admit: 2019-02-10 | Payer: BC Managed Care – PPO | Source: Home / Self Care | Admitting: Obstetrics & Gynecology

## 2019-02-10 ENCOUNTER — Inpatient Hospital Stay (HOSPITAL_COMMUNITY)
Admission: AD | Admit: 2019-02-10 | Discharge: 2019-02-11 | Disposition: A | Discharge status: Home / Self Care | Payer: BC Managed Care – PPO | Attending: Obstetrics & Gynecology | Admitting: Obstetrics & Gynecology

## 2019-02-10 ENCOUNTER — Other Ambulatory Visit: Payer: Self-pay

## 2019-02-10 DIAGNOSIS — O99333 Smoking (tobacco) complicating pregnancy, third trimester: Secondary | ICD-10-CM | POA: Diagnosis not present

## 2019-02-10 DIAGNOSIS — Z3A30 30 weeks gestation of pregnancy: Secondary | ICD-10-CM | POA: Insufficient documentation

## 2019-02-10 DIAGNOSIS — F17210 Nicotine dependence, cigarettes, uncomplicated: Secondary | ICD-10-CM | POA: Insufficient documentation

## 2019-02-10 DIAGNOSIS — O4703 False labor before 37 completed weeks of gestation, third trimester: Secondary | ICD-10-CM | POA: Insufficient documentation

## 2019-02-10 HISTORY — DX: Gestational diabetes mellitus in pregnancy, unspecified control: O24.419

## 2019-02-10 LAB — FETAL FIBRONECTIN: Fetal Fibronectin: NEGATIVE

## 2019-02-10 LAB — URINALYSIS, ROUTINE W REFLEX MICROSCOPIC
Bilirubin Urine: NEGATIVE
Glucose, UA: NEGATIVE mg/dL
Hgb urine dipstick: NEGATIVE
Ketones, ur: NEGATIVE mg/dL
Leukocytes,Ua: NEGATIVE
Nitrite: NEGATIVE
Protein, ur: NEGATIVE mg/dL
Specific Gravity, Urine: 1.008 (ref 1.005–1.030)
pH: 6 (ref 5.0–8.0)

## 2019-02-10 MED ORDER — NIFEDIPINE 10 MG PO CAPS
10.0000 mg | ORAL_CAPSULE | ORAL | Status: AC | PRN
  Administered 2019-02-10 (×4): 10 mg via ORAL
  Filled 2019-02-10 (×4): qty 1

## 2019-02-10 MED ORDER — NIFEDIPINE ER OSMOTIC RELEASE 30 MG PO TB24: 30.0000 mg | ORAL_TABLET | Freq: Every day | ORAL | 0 refills | Status: DC

## 2019-02-10 NOTE — MAU Note (Signed)
Since 6 pm, felt contractions every 4-5 min apart.  No bleeding. No leaking.  Reports white thin discharge.  Baby moving well.

## 2019-02-10 NOTE — MAU Provider Note (Signed)
Chief Complaint:  Contractions   First Provider Initiated Contact with Patient 02/10/19 2039      HPI: Stacey Berg is a 33 y.o. G2P1001 at 10w2dwho presents to maternity admissions reporting uterine contractions which have become more painful this afternoon.  . She reports good fetal movement, denies LOF, vaginal bleeding, vaginal itching/burning, urinary symptoms, h/a, dizziness, n/v, diarrhea, constipation or fever/chills.   RN note: Since 6 pm, felt contractions every 4-5 min apart.  No bleeding. No leaking.  Reports white thin discharge.  Baby moving well  Past Medical History: Past Medical History:  Diagnosis Date  . No pertinent past medical history     Past obstetric history: OB History  Gravida Para Term Preterm AB Living  2 1 1     1   SAB TAB Ectopic Multiple Live Births          1    # Outcome Date GA Lbr Len/2nd Weight Sex Delivery Anes PTL Lv  2 Current           1 Term 04/08/11 [redacted]w[redacted]d 26:19 / 00:04 2580 g F Vag-Spont None  LIV    Past Surgical History: Past Surgical History:  Procedure Laterality Date  . FOOT SURGERY      Family History: Family History  Problem Relation Age of Onset  . Diabetes Maternal Grandfather   . Diabetes Paternal Grandmother     Social History: Social History   Tobacco Use  . Smoking status: Current Some Day Smoker    Types: Cigarettes  . Smokeless tobacco: Never Used  Substance Use Topics  . Alcohol use: No  . Drug use: No    Allergies: No Known Allergies  Meds:  Medications Prior to Admission  Medication Sig Dispense Refill Last Dose  . Prenatal Vit-Fe Fumarate-FA (PRENATAL MULTIVITAMIN) TABS tablet Take 1 tablet by mouth daily at 12 noon.   02/09/2019 at Unknown time    I have reviewed patient's Past Medical Hx, Surgical Hx, Family Hx, Social Hx, medications and allergies.   ROS:  Review of Systems  Constitutional: Negative for chills and fever.  Respiratory: Negative for shortness of breath.    Gastrointestinal: Positive for abdominal pain. Negative for constipation, diarrhea and nausea.  Genitourinary: Positive for pelvic pain and vaginal discharge. Negative for dysuria and vaginal bleeding.  Musculoskeletal: Negative for back pain.   Other systems negative  Physical Exam   Vitals:   02/10/19 2110 02/10/19 2111  BP: 115/73 115/73  Pulse:  93  Resp:  16  Temp:  98.3 F (36.8 C)  SpO2: 98%     Constitutional: Well-developed, well-nourished female in no acute distress.  Cardiovascular: normal rate and rhythm Respiratory: normal effort, clear to auscultation bilaterally GI: Abd soft, non-tender, gravid appropriate for gestational age.   No rebound or guarding. MS: Extremities nontender, no edema, normal ROM Neurologic: Alert and oriented x 4.  GU: Neg CVAT.  PELVIC EXAM: Dilation: Closed Effacement (%): 50 Cervical Position: Posterior Station: -3 Exam by:: marie williams cnm Dilation: Closed Effacement (%): 50 Cervical Position: Posterior Station: -3 Exam by:: marie williams cnm   FHT:  Baseline 140 , moderate variability, accelerations present, no decelerations Contractions: q 4-5 mins Irregular     Labs: Results for orders placed or performed during the hospital encounter of 02/10/19 (from the past 24 hour(s))  Urinalysis, Routine w reflex microscopic     Status: Abnormal   Collection Time: 02/10/19  8:38 PM  Result Value Ref Range   Color, Urine  YELLOW YELLOW   APPearance HAZY (A) CLEAR   Specific Gravity, Urine 1.008 1.005 - 1.030   pH 6.0 5.0 - 8.0   Glucose, UA NEGATIVE NEGATIVE mg/dL   Hgb urine dipstick NEGATIVE NEGATIVE   Bilirubin Urine NEGATIVE NEGATIVE   Ketones, ur NEGATIVE NEGATIVE mg/dL   Protein, ur NEGATIVE NEGATIVE mg/dL   Nitrite NEGATIVE NEGATIVE   Leukocytes,Ua NEGATIVE NEGATIVE  Fetal fibronectin     Status: None   Collection Time: 02/10/19  8:40 PM  Result Value Ref Range   Fetal Fibronectin NEGATIVE NEGATIVE     Imaging:   No results found.  MAU Course/MDM: I have ordered labs and reviewed results.  NST reviewed, reactive  Treatments in MAU included Procardia series x 4 doses.  Uterine contractions diminished after the last dose. Recheck of cervix was unchanged.  Fetal fibronectin was negative.Geanie Cooley Dr Kennon Rounds who recommends Rx for Procardia XL for home use.prn UCs.    Assessment: Single IUP at [redacted]w[redacted]d  Preterm uterine contractions Preterm cervical effacement  Plan: Discharge  Home Preterm labor precautions Rx Procardia XL for prn use at home Call office today to update them on how she is doing and make plan for weekend Out of work today  Hansel Feinstein CNM, MSN Certified Nurse-Midwife 02/10/2019 8:39 PM

## 2019-02-10 NOTE — Discharge Instructions (Signed)
Preterm Labor and Birth Information °Pregnancy normally lasts 39-41 weeks. Preterm labor is when labor starts early. It starts before you have been pregnant for 37 whole weeks. °What are the risk factors for preterm labor? °Preterm labor is more likely to occur in women who: °· Have an infection while pregnant. °· Have a cervix that is short. °· Have gone into preterm labor before. °· Have had surgery on their cervix. °· Are younger than age 35. °· Are older than age 35. °· Are African American. °· Are pregnant with two or more babies. °· Take street drugs while pregnant. °· Smoke while pregnant. °· Do not gain enough weight while pregnant. °· Got pregnant right after another pregnancy. °What are the symptoms of preterm labor? °Symptoms of preterm labor include: °· Cramps. The cramps may feel like the cramps some women get during their period. The cramps may happen with watery poop (diarrhea). °· Pain in the belly (abdomen). °· Pain in the lower back. °· Regular contractions or tightening. It may feel like your belly is getting tighter. °· Pressure in the lower belly that seems to get stronger. °· More fluid (discharge) leaking from the vagina. The fluid may be watery or bloody. °· Water breaking. °Why is it important to notice signs of preterm labor? °Babies who are born early may not be fully developed. They have a higher chance for: °· Long-term heart problems. °· Long-term lung problems. °· Trouble controlling body systems, like breathing. °· Bleeding in the brain. °· A condition called cerebral palsy. °· Learning difficulties. °· Death. °These risks are highest for babies who are born before 34 weeks of pregnancy. °How is preterm labor treated? °Treatment depends on: °· How long you were pregnant. °· Your condition. °· The health of your baby. °Treatment may involve: °· Having a stitch (suture) placed in your cervix. When you give birth, your cervix opens so the baby can come out. The stitch keeps the cervix  from opening too soon. °· Staying at the hospital. °· Taking or getting medicines, such as: °? Hormone medicines. °? Medicines to stop contractions. °? Medicines to help the baby’s lungs develop. °? Medicines to prevent your baby from having cerebral palsy. °What should I do if I am in preterm labor? °If you think you are going into labor too soon, call your doctor right away. °How can I prevent preterm labor? °· Do not use any tobacco products. °? Examples of these are cigarettes, chewing tobacco, and e-cigarettes. °? If you need help quitting, ask your doctor. °· Do not use street drugs. °· Do not use any medicines unless you ask your doctor if they are safe for you. °· Talk with your doctor before taking any herbal supplements. °· Make sure you gain enough weight. °· Watch for infection. If you think you might have an infection, get it checked right away. °· If you have gone into preterm labor before, tell your doctor. °This information is not intended to replace advice given to you by your health care provider. Make sure you discuss any questions you have with your health care provider. °Document Released: 07/18/2008 Document Revised: 08/13/2018 Document Reviewed: 09/12/2015 °Elsevier Patient Education © 2020 [employer] ° °

## 2019-03-01 DIAGNOSIS — O09523 Supervision of elderly multigravida, third trimester: Secondary | ICD-10-CM | POA: Diagnosis not present

## 2019-03-01 DIAGNOSIS — O24410 Gestational diabetes mellitus in pregnancy, diet controlled: Secondary | ICD-10-CM | POA: Diagnosis not present

## 2019-03-10 ENCOUNTER — Inpatient Hospital Stay (HOSPITAL_COMMUNITY)
Admission: AD | Admit: 2019-03-10 | Discharge: 2019-03-10 | Disposition: A | Discharge status: Home / Self Care | Payer: BC Managed Care – PPO | Attending: Obstetrics & Gynecology | Admitting: Obstetrics & Gynecology

## 2019-03-10 ENCOUNTER — Other Ambulatory Visit: Payer: Self-pay

## 2019-03-10 ENCOUNTER — Encounter (HOSPITAL_COMMUNITY): Payer: Self-pay

## 2019-03-10 DIAGNOSIS — Z3A34 34 weeks gestation of pregnancy: Secondary | ICD-10-CM | POA: Diagnosis not present

## 2019-03-10 DIAGNOSIS — O4700 False labor before 37 completed weeks of gestation, unspecified trimester: Secondary | ICD-10-CM

## 2019-03-10 DIAGNOSIS — Z8632 Personal history of gestational diabetes: Secondary | ICD-10-CM | POA: Diagnosis not present

## 2019-03-10 DIAGNOSIS — Z3689 Encounter for other specified antenatal screening: Secondary | ICD-10-CM | POA: Diagnosis not present

## 2019-03-10 DIAGNOSIS — Z833 Family history of diabetes mellitus: Secondary | ICD-10-CM | POA: Insufficient documentation

## 2019-03-10 DIAGNOSIS — Z87891 Personal history of nicotine dependence: Secondary | ICD-10-CM | POA: Diagnosis not present

## 2019-03-10 DIAGNOSIS — O4703 False labor before 37 completed weeks of gestation, third trimester: Secondary | ICD-10-CM

## 2019-03-10 DIAGNOSIS — O479 False labor, unspecified: Secondary | ICD-10-CM

## 2019-03-10 LAB — URINALYSIS, ROUTINE W REFLEX MICROSCOPIC
Bilirubin Urine: NEGATIVE
Glucose, UA: NEGATIVE mg/dL
Hgb urine dipstick: NEGATIVE
Ketones, ur: NEGATIVE mg/dL
Nitrite: NEGATIVE
Protein, ur: NEGATIVE mg/dL
Specific Gravity, Urine: 1.011 (ref 1.005–1.030)
pH: 7 (ref 5.0–8.0)

## 2019-03-10 LAB — FETAL FIBRONECTIN: Fetal Fibronectin: NEGATIVE

## 2019-03-10 MED ORDER — LACTATED RINGERS IV BOLUS
1000.0000 mL | Freq: Once | INTRAVENOUS | Status: AC
  Administered 2019-03-10: 17:00:00 1000 mL via INTRAVENOUS

## 2019-03-10 MED ORDER — CYCLOBENZAPRINE HCL 10 MG PO TABS
10.0000 mg | ORAL_TABLET | Freq: Once | ORAL | Status: AC
  Administered 2019-03-10: 18:00:00 10 mg via ORAL
  Filled 2019-03-10: qty 1

## 2019-03-10 MED ORDER — NIFEDIPINE 10 MG PO CAPS
10.0000 mg | ORAL_CAPSULE | Freq: Once | ORAL | Status: AC
  Administered 2019-03-10: 18:00:00 10 mg via ORAL
  Filled 2019-03-10: qty 1

## 2019-03-10 MED ORDER — NIFEDIPINE 10 MG PO CAPS
10.0000 mg | ORAL_CAPSULE | ORAL | Status: AC | PRN
  Administered 2019-03-10 (×3): 10 mg via ORAL
  Filled 2019-03-10 (×3): qty 1

## 2019-03-10 NOTE — Discharge Instructions (Signed)
Preterm Labor and Birth Information °Pregnancy normally lasts 39-41 weeks. Preterm labor is when labor starts early. It starts before you have been pregnant for 37 whole weeks. °What are the risk factors for preterm labor? °Preterm labor is more likely to occur in women who: °· Have an infection while pregnant. °· Have a cervix that is short. °· Have gone into preterm labor before. °· Have had surgery on their cervix. °· Are younger than age 35. °· Are older than age 35. °· Are African American. °· Are pregnant with two or more babies. °· Take street drugs while pregnant. °· Smoke while pregnant. °· Do not gain enough weight while pregnant. °· Got pregnant right after another pregnancy. °What are the symptoms of preterm labor? °Symptoms of preterm labor include: °· Cramps. The cramps may feel like the cramps some women get during their period. The cramps may happen with watery poop (diarrhea). °· Pain in the belly (abdomen). °· Pain in the lower back. °· Regular contractions or tightening. It may feel like your belly is getting tighter. °· Pressure in the lower belly that seems to get stronger. °· More fluid (discharge) leaking from the vagina. The fluid may be watery or bloody. °· Water breaking. °Why is it important to notice signs of preterm labor? °Babies who are born early may not be fully developed. They have a higher chance for: °· Long-term heart problems. °· Long-term lung problems. °· Trouble controlling body systems, like breathing. °· Bleeding in the brain. °· A condition called cerebral palsy. °· Learning difficulties. °· Death. °These risks are highest for babies who are born before 34 weeks of pregnancy. °How is preterm labor treated? °Treatment depends on: °· How long you were pregnant. °· Your condition. °· The health of your baby. °Treatment may involve: °· Having a stitch (suture) placed in your cervix. When you give birth, your cervix opens so the baby can come out. The stitch keeps the cervix  from opening too soon. °· Staying at the hospital. °· Taking or getting medicines, such as: °? Hormone medicines. °? Medicines to stop contractions. °? Medicines to help the baby’s lungs develop. °? Medicines to prevent your baby from having cerebral palsy. °What should I do if I am in preterm labor? °If you think you are going into labor too soon, call your doctor right away. °How can I prevent preterm labor? °· Do not use any tobacco products. °? Examples of these are cigarettes, chewing tobacco, and e-cigarettes. °? If you need help quitting, ask your doctor. °· Do not use street drugs. °· Do not use any medicines unless you ask your doctor if they are safe for you. °· Talk with your doctor before taking any herbal supplements. °· Make sure you gain enough weight. °· Watch for infection. If you think you might have an infection, get it checked right away. °· If you have gone into preterm labor before, tell your doctor. °This information is not intended to replace advice given to you by your health care provider. Make sure you discuss any questions you have with your health care provider. °Document Released: 07/18/2008 Document Revised: 08/13/2018 Document Reviewed: 09/12/2015 °Elsevier Patient Education © 2020 [employer] ° °

## 2019-03-10 NOTE — MAU Note (Addendum)
Pt c/o that started yesterday morning - now 5-8 mins apart. Denies LOF, VB, +FM.  Has been assessed before for preterm contractions, took her nifedipine yesterday but it didn't help. Rating pain 7-8/10.

## 2019-03-10 NOTE — MAU Provider Note (Signed)
History     CSN: [id number]  Arrival date and time: 03/10/19 1611   First Provider Initiated Contact with Patient 03/10/19 1653      Chief Complaint  Patient presents with  . Contractions   HPI Stacey Berg is a 33 y.o. G2P1001 at [redacted]w[redacted]d who presents to MAU with chief complaint of painful preterm contractions. This is a recurrent problem for which patient was evaluated in MAU on 02/10/2019. Her current episode began yesterday morning (03/09/2019) and continued throughout the day yesterday and today. She states her pain score is about the same since onset, 7-9/10 but she called her on-call provider and was advised to present to MAU for evaluation.    She denies vaginal bleeding, leaking of fluid, decreased fetal movement, fever, falls, or recent illness.  Patient was given prescription for Procardia XL for contraction pain and most recently took it yesterday 03/09/2019.  She receives prenatal care with [facility].  OB History    Gravida  2   Para  1   Term  1   Preterm      AB      Living  1     SAB      TAB      Ectopic      Multiple      Live Births  1           Past Medical History:  Diagnosis Date  . Gestational diabetes   . No pertinent past medical history     Past Surgical History:  Procedure Laterality Date  . FOOT SURGERY      Family History  Problem Relation Age of Onset  . Diabetes Maternal Grandfather   . Diabetes Paternal Grandmother     Social History   Tobacco Use  . Smoking status: Former Smoker    Types: Cigarettes    Quit date: 07/04/2018    Years since quitting: 0.6  . Smokeless tobacco: Never Used  Substance Use Topics  . Alcohol use: No  . Drug use: No    Allergies: No Known Allergies  Medications Prior to Admission  Medication Sig Dispense Refill Last Dose  . NIFEdipine (PROCARDIA XL) 30 MG 24 hr tablet Take 1 tablet (30 mg total) by mouth daily. 30 tablet 0 03/09/2019 at Unknown time  . Prenatal Vit-Fe  Fumarate-FA (PRENATAL MULTIVITAMIN) TABS tablet Take 1 tablet by mouth daily at 12 noon.   03/09/2019 at Unknown time    Review of Systems  Constitutional: Negative for chills, fatigue and fever.  Respiratory: Negative for shortness of breath.   Gastrointestinal: Positive for abdominal pain.  Genitourinary: Negative for difficulty urinating, vaginal bleeding, vaginal discharge and vaginal pain.  Musculoskeletal: Negative for back pain.  Neurological: Negative for dizziness.  All other systems reviewed and are negative.  Physical Exam   Blood pressure 131/73, pulse 87, temperature 98.7 F (37.1 C), temperature source Oral, resp. rate 18, height 5\' 4"  (1.626 m), weight 78.9 kg, last menstrual period 07/25/2018, SpO2 (!) 5 %, unknown if currently breastfeeding.  Physical Exam  Nursing note and vitals reviewed. Constitutional: She is oriented to person, place, and time. She appears well-developed and well-nourished.  Cardiovascular: Normal rate.  Respiratory: Effort normal and breath sounds normal.  GI: Soft. She exhibits no distension. There is no abdominal tenderness. There is no rebound and no guarding.  Gravid, ctx palpate mild  Genitourinary:    Vagina and uterus normal.     No vaginal discharge.   Neurological:  She is alert and oriented to person, place, and time.  Skin: Skin is warm and dry.  Psychiatric: She has a normal mood and affect. Her behavior is normal. Judgment and thought content normal.    MAU Course/MDM  Procedures  --Cervix closed on arrival, remains closed following 3 hours of evaluation in MAU --Reactive tracing: baseline 135, moderate variability, positive 15x15 accels, no decels --Toco: initially irregular ctx q 2-7 min, rare contractions with UI at discharge --At 1925, patient states "not pain, really more pressure".   Patient Vitals for the past 24 hrs:  BP Temp Temp src Pulse Resp SpO2 Height Weight  03/10/19 1927 138/81 - - - - - - -  03/10/19 1816  131/73 - - - - - - -  03/10/19 1753 132/80 - - - - - - -  03/10/19 1733 131/78 - - - - - - -  03/10/19 1708 126/77 - - - - - - -  03/10/19 1650 132/75 - - 87 - (!) 5 % - -  03/10/19 1645 - - - - - 99 % - -  03/10/19 1625 131/70 98.7 F (37.1 C) Oral 94 18 99 % 5\' 4"  (1.626 m) 78.9 kg   Results for orders placed or performed during the hospital encounter of 03/10/19 (from the past 24 hour(s))  Urinalysis, Routine w reflex microscopic     Status: Abnormal   Collection Time: 03/10/19  4:28 PM  Result Value Ref Range   Color, Urine YELLOW YELLOW   APPearance CLEAR CLEAR   Specific Gravity, Urine 1.011 1.005 - 1.030   pH 7.0 5.0 - 8.0   Glucose, UA NEGATIVE NEGATIVE mg/dL   Hgb urine dipstick NEGATIVE NEGATIVE   Bilirubin Urine NEGATIVE NEGATIVE   Ketones, ur NEGATIVE NEGATIVE mg/dL   Protein, ur NEGATIVE NEGATIVE mg/dL   Nitrite NEGATIVE NEGATIVE   Leukocytes,Ua SMALL (A) NEGATIVE   RBC / HPF 0-5 0 - 5 RBC/hpf   WBC, UA 0-5 0 - 5 WBC/hpf   Bacteria, UA MANY (A) NONE SEEN   Squamous Epithelial / LPF 0-5 0 - 5  Fetal fibronectin     Status: None   Collection Time: 03/10/19  5:01 PM  Result Value Ref Range   Fetal Fibronectin NEGATIVE NEGATIVE   Assessment and Plan  --33 y.o. G2P1001 at [redacted]w[redacted]d with preterm contractions --Reactive tracing --S/p 3 hours observation in MAU, cervix remains closed --Negative FFN --Continue Procardia XL for discomfort as previously prescribed --Discharge home in stable condition  F/U: --Next appt [facility] 03/15/2019  13/02/2019, CNM 03/10/2019, 8:21 PM

## 2019-03-18 ENCOUNTER — Encounter (HOSPITAL_COMMUNITY): Payer: Self-pay | Admitting: *Deleted

## 2019-03-18 ENCOUNTER — Other Ambulatory Visit: Payer: Self-pay

## 2019-03-18 ENCOUNTER — Inpatient Hospital Stay (HOSPITAL_COMMUNITY)
Admission: AD | Admit: 2019-03-18 | Discharge: 2019-03-18 | Disposition: A | Discharge status: Home / Self Care | Payer: BC Managed Care – PPO | Attending: Obstetrics & Gynecology | Admitting: Obstetrics & Gynecology

## 2019-03-18 DIAGNOSIS — O4703 False labor before 37 completed weeks of gestation, third trimester: Secondary | ICD-10-CM | POA: Diagnosis not present

## 2019-03-18 DIAGNOSIS — Z87891 Personal history of nicotine dependence: Secondary | ICD-10-CM | POA: Diagnosis not present

## 2019-03-18 DIAGNOSIS — Z0371 Encounter for suspected problem with amniotic cavity and membrane ruled out: Secondary | ICD-10-CM | POA: Diagnosis not present

## 2019-03-18 DIAGNOSIS — Z8632 Personal history of gestational diabetes: Secondary | ICD-10-CM | POA: Insufficient documentation

## 2019-03-18 DIAGNOSIS — Z833 Family history of diabetes mellitus: Secondary | ICD-10-CM | POA: Insufficient documentation

## 2019-03-18 DIAGNOSIS — Z3A35 35 weeks gestation of pregnancy: Secondary | ICD-10-CM

## 2019-03-18 LAB — URINALYSIS, ROUTINE W REFLEX MICROSCOPIC
Bilirubin Urine: NEGATIVE
Glucose, UA: NEGATIVE mg/dL
Hgb urine dipstick: NEGATIVE
Ketones, ur: NEGATIVE mg/dL
Leukocytes,Ua: NEGATIVE
Nitrite: NEGATIVE
Protein, ur: NEGATIVE mg/dL
Specific Gravity, Urine: 1.013 (ref 1.005–1.030)
pH: 6 (ref 5.0–8.0)

## 2019-03-18 LAB — AMNISURE RUPTURE OF MEMBRANE (ROM) NOT AT ARMC: Amnisure ROM: NEGATIVE

## 2019-03-18 LAB — POCT FERN TEST: POCT Fern Test: NEGATIVE

## 2019-03-18 NOTE — MAU Note (Signed)
Noticed, underwear was very wet today, ? Leaking, or just increase in d/c. First noted around 1330. Usual [address].

## 2019-03-18 NOTE — MAU Provider Note (Signed)
Chief Complaint:  Abdominal Pain and Rupture of Membranes   First Provider Initiated Contact with Patient 03/18/19 1807      HPI: Stacey Berg is a 33 y.o. G2P1001 at [redacted]w[redacted]d who presents to maternity admissions reporting irregular abdominal cramping and leaking fluid today enough to wet her underwear. The leaking did not require a pad.  There has been no additional leakage.  She reports irregular cramping but this is unchanged in the last 2 weeks.  She has no other symptoms. She has not tried any treatments.  She reports good fetal movement.   HPI  Past Medical History: Past Medical History:  Diagnosis Date  . Gestational diabetes   . No pertinent past medical history     Past obstetric history: OB History  Gravida Para Term Preterm AB Living  2 1 1     1   SAB TAB Ectopic Multiple Live Births          1    # Outcome Date GA Lbr Len/2nd Weight Sex Delivery Anes PTL Lv  2 Current           1 Term 04/08/11 [redacted]w[redacted]d 26:19 / 00:04 2580 g F Vag-Spont None  LIV    Past Surgical History: Past Surgical History:  Procedure Laterality Date  . FOOT SURGERY      Family History: Family History  Problem Relation Age of Onset  . Diabetes Maternal Grandfather   . Diabetes Paternal Grandmother     Social History: Social History   Tobacco Use  . Smoking status: Former Smoker    Types: Cigarettes    Quit date: 07/04/2018    Years since quitting: 0.7  . Smokeless tobacco: Never Used  Substance Use Topics  . Alcohol use: No  . Drug use: No    Allergies: No Known Allergies  Meds:  Medications Prior to Admission  Medication Sig Dispense Refill Last Dose  . NIFEdipine (PROCARDIA XL) 30 MG 24 hr tablet Take 1 tablet (30 mg total) by mouth daily. 30 tablet 0 Past Month at Unknown time  . Prenatal Vit-Fe Fumarate-FA (PRENATAL MULTIVITAMIN) TABS tablet Take 1 tablet by mouth daily at 12 noon.   03/18/2019 at Unknown time    ROS:  Review of Systems  Constitutional: Negative for  chills, fatigue and fever.  Eyes: Negative for visual disturbance.  Respiratory: Negative for shortness of breath.   Cardiovascular: Negative for chest pain.  Gastrointestinal: Positive for abdominal pain. Negative for nausea and vomiting.  Genitourinary: Positive for vaginal discharge. Negative for difficulty urinating, dysuria, flank pain, pelvic pain, vaginal bleeding and vaginal pain.  Neurological: Negative for dizziness and headaches.  Psychiatric/Behavioral: Negative.      I have reviewed patient's Past Medical Hx, Surgical Hx, Family Hx, Social Hx, medications and allergies.   Physical Exam   Patient Vitals for the past 24 hrs:  BP Temp Temp src Pulse Resp SpO2 Height Weight  03/18/19 1700 128/75 98.6 F (37 C) Oral 90 16 99 % 5\' 4"  (1.626 m) 79.4 kg   Constitutional: Well-developed, well-nourished female in no acute distress.  Cardiovascular: normal rate Respiratory: normal effort GI: Abd soft, non-tender, gravid appropriate for gestational age.  MS: Extremities nontender, no edema, normal ROM Neurologic: Alert and oriented x 4.  GU: Neg CVAT.  PELVIC EXAM: Cervix pink, visually closed, without lesion, scant white creamy discharge, vaginal walls and external genitalia normal      FHT:  Baseline 145, moderate variability, accelerations present, no decelerations Contractions: q  10-15 mins, mild to palpation   Labs: Results for orders placed or performed during the hospital encounter of 03/18/19 (from the past 24 hour(s))  Urinalysis, Routine w reflex microscopic     Status: Abnormal   Collection Time: 03/18/19  5:03 PM  Result Value Ref Range   Color, Urine YELLOW YELLOW   APPearance HAZY (A) CLEAR   Specific Gravity, Urine 1.013 1.005 - 1.030   pH 6.0 5.0 - 8.0   Glucose, UA NEGATIVE NEGATIVE mg/dL   Hgb urine dipstick NEGATIVE NEGATIVE   Bilirubin Urine NEGATIVE NEGATIVE   Ketones, ur NEGATIVE NEGATIVE mg/dL   Protein, ur NEGATIVE NEGATIVE mg/dL   Nitrite  NEGATIVE NEGATIVE   Leukocytes,Ua NEGATIVE NEGATIVE  Amnisure rupture of membrane (rom)     Status: None   Collection Time: 03/18/19  6:54 PM  Result Value Ref Range   Amnisure ROM NEGATIVE       Imaging:  No results found.  MAU Course/MDM: Orders Placed This Encounter  Procedures  . Urinalysis, Routine w reflex microscopic  . Amnisure rupture of membrane (rom)  . Discharge patient    No orders of the defined types were placed in this encounter.    NST reviewed and reactive No evidence of ROM today with negative pooling, ferning, and amnisure Cervix visually closed D/C home with labor precautions F/U in office as scheduled Return to MAU as needed for signs of labor or emergencies   Assessment: 1. Encounter for suspected premature rupture of amniotic membranes, with rupture of membranes not found   2. Threatened premature labor in third trimester     Plan: Discharge home Labor precautions and fetal kick counts Follow-up Information    Gynecology, [facility].   Specialty: Obstetrics and Gynecology Why: As scheduled, return to MAU as needed for signs of labor or emergencies. Contact information: [address] [place] [address] [phone]          Allergies as of 03/18/2019   No Known Allergies     Medication List    TAKE these medications   NIFEdipine 30 MG 24 hr tablet Commonly known as: Procardia XL Take 1 tablet (30 mg total) by mouth daily.   prenatal multivitamin Tabs tablet Take 1 tablet by mouth daily at 12 noon.       Sharen Counter Certified Nurse-Midwife 03/18/2019 7:59 PM

## 2019-03-22 DIAGNOSIS — O09523 Supervision of elderly multigravida, third trimester: Secondary | ICD-10-CM | POA: Diagnosis not present

## 2019-03-24 LAB — OB RESULTS CONSOLE GBS: GBS: POSITIVE

## 2019-03-28 ENCOUNTER — Inpatient Hospital Stay (HOSPITAL_COMMUNITY)
Admission: AD | Admit: 2019-03-28 | Discharge: 2019-03-29 | DRG: 805 | Disposition: A | Discharge status: Home / Self Care | Payer: BC Managed Care – PPO | Attending: Obstetrics & Gynecology | Admitting: Obstetrics & Gynecology

## 2019-03-28 ENCOUNTER — Other Ambulatory Visit: Payer: Self-pay

## 2019-03-28 ENCOUNTER — Encounter (HOSPITAL_COMMUNITY): Payer: Self-pay

## 2019-03-28 DIAGNOSIS — O9902 Anemia complicating childbirth: Secondary | ICD-10-CM | POA: Diagnosis present

## 2019-03-28 DIAGNOSIS — U071 COVID-19: Secondary | ICD-10-CM | POA: Diagnosis present

## 2019-03-28 DIAGNOSIS — O4703 False labor before 37 completed weeks of gestation, third trimester: Secondary | ICD-10-CM

## 2019-03-28 DIAGNOSIS — O99824 Streptococcus B carrier state complicating childbirth: Secondary | ICD-10-CM | POA: Diagnosis not present

## 2019-03-28 DIAGNOSIS — O9852 Other viral diseases complicating childbirth: Secondary | ICD-10-CM | POA: Diagnosis not present

## 2019-03-28 DIAGNOSIS — Z3A36 36 weeks gestation of pregnancy: Secondary | ICD-10-CM | POA: Diagnosis not present

## 2019-03-28 DIAGNOSIS — O24420 Gestational diabetes mellitus in childbirth, diet controlled: Secondary | ICD-10-CM | POA: Diagnosis not present

## 2019-03-28 DIAGNOSIS — D573 Sickle-cell trait: Secondary | ICD-10-CM | POA: Diagnosis present

## 2019-03-28 DIAGNOSIS — Z87891 Personal history of nicotine dependence: Secondary | ICD-10-CM

## 2019-03-28 LAB — TYPE AND SCREEN
ABO/RH(D): A POS
Antibody Screen: NEGATIVE

## 2019-03-28 LAB — URINALYSIS, ROUTINE W REFLEX MICROSCOPIC
Bilirubin Urine: NEGATIVE
Glucose, UA: NEGATIVE mg/dL
Hgb urine dipstick: NEGATIVE
Ketones, ur: NEGATIVE mg/dL
Nitrite: NEGATIVE
Protein, ur: NEGATIVE mg/dL
Specific Gravity, Urine: 1.012 (ref 1.005–1.030)
pH: 6 (ref 5.0–8.0)

## 2019-03-28 LAB — RPR: RPR Ser Ql: NONREACTIVE

## 2019-03-28 LAB — CBC
HCT: 38.1 % (ref 36.0–46.0)
Hemoglobin: 12.5 g/dL (ref 12.0–15.0)
MCH: 24 pg — ABNORMAL LOW (ref 26.0–34.0)
MCHC: 32.8 g/dL (ref 30.0–36.0)
MCV: 73.1 fL — ABNORMAL LOW (ref 80.0–100.0)
Platelets: 256 10*3/uL (ref 150–400)
RBC: 5.21 MIL/uL — ABNORMAL HIGH (ref 3.87–5.11)
RDW: 13.7 % (ref 11.5–15.5)
WBC: 10.5 10*3/uL (ref 4.0–10.5)
nRBC: 0 % (ref 0.0–0.2)

## 2019-03-28 LAB — ABO/RH: ABO/RH(D): A POS

## 2019-03-28 LAB — SARS CORONAVIRUS 2 BY RT PCR (HOSPITAL ORDER, PERFORMED IN ~~LOC~~ HOSPITAL LAB): SARS Coronavirus 2: POSITIVE — AB

## 2019-03-28 LAB — GLUCOSE, CAPILLARY: Glucose-Capillary: 92 mg/dL (ref 70–99)

## 2019-03-28 MED ORDER — DIBUCAINE (PERIANAL) 1 % EX OINT: 1.0000 "application " | TOPICAL_OINTMENT | CUTANEOUS | Status: DC | PRN

## 2019-03-28 MED ORDER — TERBUTALINE SULFATE 1 MG/ML IJ SOLN: 0.2500 mg | Freq: Once | INTRAMUSCULAR | Status: DC | PRN

## 2019-03-28 MED ORDER — ZOLPIDEM TARTRATE 5 MG PO TABS: 5.0000 mg | ORAL_TABLET | Freq: Every evening | ORAL | Status: DC | PRN

## 2019-03-28 MED ORDER — OXYCODONE-ACETAMINOPHEN 5-325 MG PO TABS: 1.0000 | ORAL_TABLET | ORAL | Status: DC | PRN

## 2019-03-28 MED ORDER — OXYTOCIN 40 UNITS IN NORMAL SALINE INFUSION - SIMPLE MED: 2.5000 [IU]/h | INTRAVENOUS | Status: DC

## 2019-03-28 MED ORDER — IBUPROFEN 600 MG PO TABS
600.0000 mg | ORAL_TABLET | Freq: Four times a day (QID) | ORAL | Status: DC
  Administered 2019-03-28 – 2019-03-29 (×4): 600 mg via ORAL
  Filled 2019-03-28 (×4): qty 1

## 2019-03-28 MED ORDER — ONDANSETRON HCL 4 MG PO TABS: 4.0000 mg | ORAL_TABLET | ORAL | Status: DC | PRN

## 2019-03-28 MED ORDER — FENTANYL CITRATE (PF) 100 MCG/2ML IJ SOLN
INTRAMUSCULAR | Status: AC
  Filled 2019-03-28: qty 2

## 2019-03-28 MED ORDER — OXYTOCIN BOLUS FROM INFUSION: 500.0000 mL | Freq: Once | INTRAVENOUS | Status: DC

## 2019-03-28 MED ORDER — PENICILLIN G POT IN DEXTROSE 60000 UNIT/ML IV SOLN
3.0000 10*6.[IU] | INTRAVENOUS | Status: DC
  Administered 2019-03-28: 3 10*6.[IU] via INTRAVENOUS
  Filled 2019-03-28 (×3): qty 50

## 2019-03-28 MED ORDER — PRENATAL MULTIVITAMIN CH
1.0000 | ORAL_TABLET | Freq: Every day | ORAL | Status: DC
  Administered 2019-03-29: 1 via ORAL
  Filled 2019-03-28: qty 1

## 2019-03-28 MED ORDER — OXYCODONE-ACETAMINOPHEN 5-325 MG PO TABS: 2.0000 | ORAL_TABLET | ORAL | Status: DC | PRN

## 2019-03-28 MED ORDER — ACETAMINOPHEN 325 MG PO TABS: 650.0000 mg | ORAL_TABLET | ORAL | Status: DC | PRN

## 2019-03-28 MED ORDER — BENZOCAINE-MENTHOL 20-0.5 % EX AERO: 1.0000 "application " | INHALATION_SPRAY | CUTANEOUS | Status: DC | PRN

## 2019-03-28 MED ORDER — LACTATED RINGERS IV SOLN: 500.0000 mL | INTRAVENOUS | Status: DC | PRN

## 2019-03-28 MED ORDER — LIDOCAINE HCL (PF) 1 % IJ SOLN
30.0000 mL | INTRAMUSCULAR | Status: AC | PRN
  Administered 2019-03-28: 30 mL via SUBCUTANEOUS
  Filled 2019-03-28: qty 30

## 2019-03-28 MED ORDER — SOD CITRATE-CITRIC ACID 500-334 MG/5ML PO SOLN: 30.0000 mL | ORAL | Status: DC | PRN

## 2019-03-28 MED ORDER — LACTATED RINGERS IV SOLN
INTRAVENOUS | Status: DC
  Administered 2019-03-28 (×3): via INTRAVENOUS

## 2019-03-28 MED ORDER — SENNOSIDES-DOCUSATE SODIUM 8.6-50 MG PO TABS
2.0000 | ORAL_TABLET | ORAL | Status: DC
  Administered 2019-03-28: 2 via ORAL
  Filled 2019-03-28: qty 2

## 2019-03-28 MED ORDER — COCONUT OIL OIL: 1.0000 "application " | TOPICAL_OIL | Status: DC | PRN

## 2019-03-28 MED ORDER — FLEET ENEMA 7-19 GM/118ML RE ENEM: 1.0000 | ENEMA | Freq: Every day | RECTAL | Status: DC | PRN

## 2019-03-28 MED ORDER — OXYTOCIN 40 UNITS IN NORMAL SALINE INFUSION - SIMPLE MED
1.0000 m[IU]/min | INTRAVENOUS | Status: DC
  Administered 2019-03-28: 10:00:00 2 m[IU]/min via INTRAVENOUS

## 2019-03-28 MED ORDER — SODIUM CHLORIDE 0.9 % IV SOLN
5.0000 10*6.[IU] | Freq: Once | INTRAVENOUS | Status: AC
  Administered 2019-03-28: 5 10*6.[IU] via INTRAVENOUS
  Filled 2019-03-28: qty 5

## 2019-03-28 MED ORDER — OXYTOCIN 40 UNITS IN NORMAL SALINE INFUSION - SIMPLE MED
INTRAVENOUS | Status: AC
  Filled 2019-03-28: qty 1000

## 2019-03-28 MED ORDER — SIMETHICONE 80 MG PO CHEW: 80.0000 mg | CHEWABLE_TABLET | ORAL | Status: DC | PRN

## 2019-03-28 MED ORDER — ONDANSETRON HCL 4 MG/2ML IJ SOLN: 4.0000 mg | Freq: Four times a day (QID) | INTRAMUSCULAR | Status: DC | PRN

## 2019-03-28 MED ORDER — WITCH HAZEL-GLYCERIN EX PADS: 1.0000 "application " | MEDICATED_PAD | CUTANEOUS | Status: DC | PRN

## 2019-03-28 MED ORDER — LACTATED RINGERS IV BOLUS: 500.0000 mL | Freq: Once | INTRAVENOUS | Status: DC

## 2019-03-28 MED ORDER — ONDANSETRON HCL 4 MG/2ML IJ SOLN: 4.0000 mg | INTRAMUSCULAR | Status: DC | PRN

## 2019-03-28 MED ORDER — FENTANYL CITRATE (PF) 100 MCG/2ML IJ SOLN
100.0000 ug | INTRAMUSCULAR | Status: DC | PRN
  Administered 2019-03-28: 100 ug via INTRAVENOUS

## 2019-03-28 MED ORDER — DIPHENHYDRAMINE HCL 25 MG PO CAPS: 25.0000 mg | ORAL_CAPSULE | Freq: Four times a day (QID) | ORAL | Status: DC | PRN

## 2019-03-28 NOTE — Lactation Note (Signed)
This note was copied from a baby's chart. Lactation Consultation Note  Patient Name: Stacey Berg [id number] Date: 03/28/2019 Reason for consult: Term;Other (Comment);Initial assessment(Hypoglycemia)  P2 mother whose infant is now 29 hours old.  This is a LPTI at 36+6 weeks.  Mother breast fed her first child (now 35 years old) for 15 months.    Mother is Covid+ and asymptomatic.  Baby was awake and alert when I arrived.  Offered to assist with latching and mother accepted.  Mother's breasts are soft and non tender and nipples are large, everted and intact.  Mother did not seem to want latch assistance and placed baby in the cradle hold on the left breast.  Baby initially had a shallow latch and I offered to raise the baby's head and position her higher in mother's arms.  Demonstrated breast compressions for mother.  Reminded her of placement deep into the breast tissue and to be observant for relaxation with feeding and the possibility of the baby to lose a good latch.  Mother verbalized understanding. During this feeding the RN spoke with me and alerted me that baby would need glucose gel for a blood sugar of 32 mg/dl.  I informed her that baby was latching at that time.  Observed baby feeding for 12 minutes with intermittent gentle stimulation to keep her sucking.  Advised mother to do the same stimulation during all feedings to remind baby to continue sucking.  Reviewed the LPTI policy guideline sheet in detail.  Discussed supplementation guidelines for minimum feeding volumes.  Baby has already fed one time and consumed 15 mls with no difficulty.  Reminded mother that if baby wants more than the minimum volume to allow her to consume more.    At the end of the breast feeding session RN in room to give glucose gel and family member to feed the formula supplementation.  I offered to initiate the DEBP for mother and she was willing to pump.  Explained the pump, pump parts, assembly, disassembly  and cleaning.  #24 flange size is appropriate at this time.  Mother taught how to collect colostrum in containers and milk storage times reviewed.  Finger feeding demonstrated and mother will feed back any EBM she obtains.  Mother knows to feed baby on cue or at least every three hours if she does not cue or self awaken.  She will call for latch assistance as needed.  RN updated.   Maternal Data Formula Feeding for Exclusion: No Has patient been taught Hand Expression?: Yes Does the patient have breastfeeding experience prior to this delivery?: Yes  Feeding Feeding Type: Breast Fed Nipple Type: Slow - flow  LATCH Score Latch: Grasps breast easily, tongue down, lips flanged, rhythmical sucking.  Audible Swallowing: A few with stimulation  Type of Nipple: Everted at rest and after stimulation  Comfort (Breast/Nipple): Soft / non-tender  Hold (Positioning): Assistance needed to correctly position infant at breast and maintain latch.  LATCH Score: 8  Interventions Interventions: Breast feeding basics reviewed;Assisted with latch;Skin to skin;Breast massage;Hand express;Breast compression;Adjust position;DEBP;Position options;Support pillows  Lactation Tools Discussed/Used Tools: Pump Breast pump type: Double-Electric Breast Pump Pump Review: Setup, frequency, and cleaning;Milk Storage Initiated by:: Paul Dykes Date initiated:: 03/28/19   Consult Status Consult Status: Follow-up Date: 03/29/19 Follow-up type: In-patient    Little Ishikawa 03/28/2019, 6:21 PM

## 2019-03-28 NOTE — MAU Note (Signed)
Patient presents to MAU c/o ctx q 3-5 mins for the past 2 hours.  +FM, denies vaginal bleeding or LOF.

## 2019-03-28 NOTE — Progress Notes (Signed)
OB PN:  S: Pt still feeling contractions every 2-56min.  O: BP 124/84   Pulse 81   Temp 98.1 F (36.7 C) (Oral)   Resp 18   Ht 5\' 4"  (1.626 m)   Wt 78 kg   LMP 07/25/2018 (Approximate)   SpO2 98%   BMI 29.52 kg/m   FHT: 135bpm, moderate variablity, + accels, no decels Toco: q2-75min SVE: 4.5/50/-2, AROM clear fluid  Results for orders placed or performed during the hospital encounter of 03/28/19 (from the past 24 hour(s))  Urinalysis, Routine w reflex microscopic     Status: Abnormal   Collection Time: 03/28/19  2:44 AM  Result Value Ref Range   Color, Urine YELLOW YELLOW   APPearance CLOUDY (A) CLEAR   Specific Gravity, Urine 1.012 1.005 - 1.030   pH 6.0 5.0 - 8.0   Glucose, UA NEGATIVE NEGATIVE mg/dL   Hgb urine dipstick NEGATIVE NEGATIVE   Bilirubin Urine NEGATIVE NEGATIVE   Ketones, ur NEGATIVE NEGATIVE mg/dL   Protein, ur NEGATIVE NEGATIVE mg/dL   Nitrite NEGATIVE NEGATIVE   Leukocytes,Ua TRACE (A) NEGATIVE   RBC / HPF 0-5 0 - 5 RBC/hpf   WBC, UA 11-20 0 - 5 WBC/hpf   Bacteria, UA MANY (A) NONE SEEN   Squamous Epithelial / LPF 6-10 0 - 5  SARS Coronavirus 2 by RT PCR (hospital order, performed in [place] hospital lab) Nasopharyngeal Nasopharyngeal Swab     Status: Abnormal   Collection Time: 03/28/19  3:04 AM   Specimen: Nasopharyngeal Swab  Result Value Ref Range   SARS Coronavirus 2 POSITIVE (A) NEGATIVE  CBC     Status: Abnormal   Collection Time: 03/28/19  3:15 AM  Result Value Ref Range   WBC 10.5 4.0 - 10.5 K/uL   RBC 5.21 (H) 3.87 - 5.11 MIL/uL   Hemoglobin 12.5 12.0 - 15.0 g/dL   HCT 38.1 36.0 - 46.0 %   MCV 73.1 (L) 80.0 - 100.0 fL   MCH 24.0 (L) 26.0 - 34.0 pg   MCHC 32.8 30.0 - 36.0 g/dL   RDW 13.7 11.5 - 15.5 %   Platelets 256 150 - 400 K/uL   nRBC 0.0 0.0 - 0.2 %  Type and screen [place]     Status: None   Collection Time: 03/28/19  3:15 AM  Result Value Ref Range   ABO/RH(D) A POS    Antibody Screen NEG    Sample  Expiration      03/31/2019,2359 Performed at [facility], [address] [place], [place] [address]   ABO/Rh     Status: None   Collection Time: 03/28/19  3:15 AM  Result Value Ref Range   ABO/RH(D)      A POS Performed at [facility] [address], [place], [place] [address]   Glucose, capillary     Status: None   Collection Time: 03/28/19  3:58 AM  Result Value Ref Range   Glucose-Capillary 92 70 - 99 mg/dL    A/P: 33 y.o. G2P1011 @ [redacted]w[redacted]d for labor 1. FWB: Cat. I 2. Labor: expectant management Pain: IV or epidural upon request GBS: positive- PCN per protocol COVID positive- PPE precautions taken, pt asymptomatic GDMA1- arrival acucheck within normal limits, no further testing needed  Janyth Pupa, DO [phone] (cell) [phone] (office)

## 2019-03-28 NOTE — Progress Notes (Addendum)
OB PN:  S: Pt still feeling contractions every 2-23min.  O: BP 132/86   Pulse 89   Temp 99.1 F (37.3 C) (Oral)   Resp 16   Ht 5\' 4"  (1.626 m)   Wt 78 kg   LMP 07/25/2018 (Approximate)   SpO2 98%   BMI 29.52 kg/m   FHT: 135bpm, moderate variablity, + accels, no decels Toco: q36min SVE: deferred  A/P: 33 y.o. G2P1011 @ [redacted]w[redacted]d for labor 1. FWB: Cat. I 2. Labor: expectant management Pain: IV or epidural upon request GBS: positive- PCN per protocol COVID positive- PPE precautions taken, pt asymptomatic GDMA1- arrival acucheck within normal limits, no further testing needed  Janyth Pupa, DO [phone] (cell) [phone] (office)

## 2019-03-28 NOTE — H&P (Addendum)
OB ADMISSION/ HISTORY & PHYSICAL:  Admission Date: 03/28/2019  2:13 AM  Admit Diagnosis: 17 WKS, CTX    Stacey Berg is a 33 y.o. female presenting for regular painful ctx since 11 pm, tried to rest and unable to do so. Denies LOF or VB, reports loosing mucous plug couple of days ago.  Desires unmedicated birth. Sister is present and supportive, FOB involved but out of town, will not be present for birth.  Prenatal History: G2P1001   EDC : 04/19/2019, by Other Basis  Prenatal care at [facility] since 10 wks Primary Dr. Nelda Marseille   Prenatal course complicated by: Preterm contractions at 35 wks, was on Procardia PRN GDM A1 Hx rapid birth, MAU delivery with first baby Hx abnormal pap with f/u cryosurgery 2000 GBS positive Sickle cell trait  Prenatal Labs: ABO, Rh:  A pos  Antibody:  neg Rubella:   NON-immune RPR:   neg HBsAg:   neg HIV:   neg GBS: Positive/-- (11/19 0000)  3 hr Glucola : abnormal Genetic Screening: normal, XX Ultrasound: normal XX anatomy, posterior placenta Growth 29 wks 65%tile, 33 wks 53%tile Tdap, UTD, declined flu vax    Maternal Diabetes: Yes:  Diabetes Type:  Diet controlled Genetic Screening: Normal Maternal Ultrasounds/Referrals: Normal Fetal Ultrasounds or other Referrals:  None Maternal Substance Abuse:  No Significant Maternal Medications:  None Significant Maternal Lab Results:  Group B Strep positive Other Comments:  None  Medical / Surgical History :  Past medical history:  Past Medical History:  Diagnosis Date  . Gestational diabetes   . No pertinent past medical history      Past surgical history:  Past Surgical History:  Procedure Laterality Date  . FOOT SURGERY       Family History:  Family History  Problem Relation Age of Onset  . Diabetes Maternal Grandfather   . Diabetes Paternal Grandmother      Social History:  reports that she quit smoking about 8 months ago. Her smoking use included cigarettes. She has  never used smokeless tobacco. She reports that she does not drink alcohol or use drugs.   Allergies: Patient has no known allergies.   Current Medications at time of admission:  Medications Prior to Admission  Medication Sig Dispense Refill Last Dose  . Prenatal Vit-Fe Fumarate-FA (PRENATAL MULTIVITAMIN) TABS tablet Take 1 tablet by mouth daily at 12 noon.   03/27/2019 at Unknown time  . NIFEdipine (PROCARDIA XL) 30 MG 24 hr tablet Take 1 tablet (30 mg total) by mouth daily. 30 tablet 0      Review of Systems: ROS Normal except ctx and discomforts of pregnancy  Physical Exam: Vital signs and nursing notes reviewed.  ED Triage Vitals [03/28/19 0223]  Enc Vitals Group     BP 130/79     Pulse Rate 86     Resp 17     Temp 98.5 F (36.9 C)     Temp Source Oral     SpO2 98 %     Weight 173 lb 14.4 oz (78.9 kg)     Height _0  (1.626 m)     Head Circumference      Peak Flow      Pain Score      Pain Loc      Pain Edu?      Excl. in [place]?      General: AAO x 3, NAD, coping well Heart: RRR Lungs:CTAB Abdomen: Gravid, NT, Leopold's vertex, OP position Extremities:  trace pedal edema Genitalia / VE: Dilation: 4 Effacement (%): 70 Station: -1 Presentation: Vertex Exam by D. Paul, CNM IB[facility]   FHR:  140 BPM, moderate variability, + accels, no decels TOCO: Ctx q 2-3, palp mild/mod  Labs:   Pending T&S, CBC, RPR  No results for input(s): WBC, HGB, HCT, PLT in the last 72 hours.  COVID 19 pending   Assessment:  33 y.o. G2P1001 at [redacted]w[redacted]d preterm ctx with cervical change GDM A1  1. Early stage of labor 2. FHR category 1 3. GBS positive 4. Desires unmedicated birth 5. Breastfeeding - planned 6. Rubella non-immune 7. Placenta disposal per patient request  Plan:  1. Admit to BS 2. Routine L&D orders, GBS prophylaxis - PCN per protocol, CBG q 4 hrs in labor 3. Analgesia/anesthesia PRN  4. Expectant management 5. Anticipate NSVB 6. MMR prior to discharge  Dr KAlesia Richards notified of admission / plan of care   [place] MSN 03/28/2019, 3:23 AM

## 2019-03-29 LAB — CBC
HCT: 35.4 % — ABNORMAL LOW (ref 36.0–46.0)
Hemoglobin: 11.6 g/dL — ABNORMAL LOW (ref 12.0–15.0)
MCH: 24.2 pg — ABNORMAL LOW (ref 26.0–34.0)
MCHC: 32.8 g/dL (ref 30.0–36.0)
MCV: 73.9 fL — ABNORMAL LOW (ref 80.0–100.0)
Platelets: 249 10*3/uL (ref 150–400)
RBC: 4.79 MIL/uL (ref 3.87–5.11)
RDW: 13.8 % (ref 11.5–15.5)
WBC: 13.9 10*3/uL — ABNORMAL HIGH (ref 4.0–10.5)
nRBC: 0 % (ref 0.0–0.2)

## 2019-03-29 LAB — GLUCOSE, CAPILLARY: Glucose-Capillary: 83 mg/dL (ref 70–99)

## 2019-03-29 MED ORDER — IBUPROFEN 600 MG PO TABS: 600.0000 mg | ORAL_TABLET | Freq: Four times a day (QID) | ORAL | 0 refills | Status: AC

## 2019-03-29 NOTE — Discharge Summary (Addendum)
OB Discharge Summary     Patient Name: Stacey Berg DOB: Jan 23, 1986 MRN: [id number]  Date of admission: 03/28/2019 Delivering MD: Myna Hidalgo   Date of discharge: 03/29/2019  Admitting diagnosis: 36 WKS, CTX Intrauterine pregnancy: [redacted]w[redacted]d     Secondary diagnosis:  Active Problems:   Labor and delivery indication for care or intervention  Additional problems: GDMA1     Discharge diagnosis: Preterm Pregnancy Delivered                                                                                                Post partum procedures:none  Augmentation: AROM  Complications: None  Hospital course:  Onset of Labor With Vaginal Delivery     33 y.o. yo [id number] at [redacted]w[redacted]d was admitted in Latent Labor on 03/28/2019. Patient had an uncomplicated labor course as follows:  Membrane Rupture Time/Date: 8:50 AM ,03/28/2019   Intrapartum Procedures: Episiotomy: None [1]                                         Lacerations:  1st degree [2]  Patient had a delivery of a Viable infant. 03/28/2019  Information for the patient's newborn:  Stacey Berg [[id number]]       Pateint had an uncomplicated postpartum course.  She is ambulating, tolerating a regular diet, passing flatus, and urinating well. Patient is discharged home in stable condition on 03/29/19.   Physical exam  Vitals:   03/28/19 1718 03/28/19 2031 03/29/19 0000 03/29/19 0500  BP: 125/76 136/79 123/83 115/72  Pulse: 80 84 77 71  Resp: 18 18 18 18   Temp: 98.5 F (36.9 C) 98.5 F (36.9 C) 98.3 F (36.8 C) 98.2 F (36.8 C)  TempSrc: Oral   Oral  SpO2:  100% 100% 100%  Weight:      Height:       General: alert, cooperative and no distress Lochia: appropriate Uterine Fundus: firm Incision: N/A DVT Evaluation: No evidence of DVT seen on physical exam. Labs: Lab Results  Component Value Date   WBC 13.9 (H) 03/29/2019   HGB 11.6 (L) 03/29/2019   HCT 35.4 (L) 03/29/2019   MCV 73.9 (L) 03/29/2019   PLT 249 03/29/2019   CMP Latest Ref Rng & Units 08/12/2014  Glucose 70 - 99 mg/dL 10/12/2014)  BUN 6 - 23 mg/dL 10  Creatinine 502(D - 7.41 mg/dL 2.87  Sodium 8.67 - 672 mmol/L 137  Potassium 3.5 - 5.1 mmol/L 3.3(L)  Chloride 96 - 112 mmol/L 103  CO2 19 - 32 mmol/L 24  Calcium 8.4 - 10.5 mg/dL 8.8    Discharge instruction: per After Visit Summary and "Baby and Me Booklet".  After visit meds:  Allergies as of 03/29/2019   No Known Allergies     Medication List    STOP taking these medications   NIFEdipine 30 MG 24 hr tablet Commonly known as: Procardia XL     TAKE these medications   ibuprofen 600 MG tablet Commonly known as: ADVIL  Take 1 tablet (600 mg total) by mouth every 6 (six) hours.   prenatal multivitamin Tabs tablet Take 1 tablet by mouth daily at 12 noon.       Diet: routine diet  Activity: Advance as tolerated. Pelvic rest for 6 weeks.   Outpatient follow up:6 weeks Follow up Appt:No future appointments. Follow up Visit:No follow-ups on file.  Postpartum contraception: Progesterone only pills  Newborn Data: Live born female  Birth Weight: 6 lb 10.2 oz (3011 g) APGAR: 9, 9  Newborn Delivery   Birth date/time: 03/28/2019 11:34:00 Delivery type: Vaginal, Spontaneous      Baby Feeding: Breast Disposition:home with mother   03/29/2019 Stacey Genta, DO

## 2019-03-29 NOTE — Lactation Note (Signed)
This note was copied from a baby's chart. Lactation Consultation Note  Patient Name: Stacey Berg [id number] Date: 03/29/2019   Mother is Ex BF and breastfed her last child for 15 mos.  Baby 47 hours old.  Mother is breastfeeding and supplementing with formula but was not able to post pump last night.  She states last night baby preferred the breast only for 2 feedings and would not take the bottle.  Discussed with mother late preterm feeding behavior and not allowing baby to "hang out" at breast and watch for swallows.  Mother states she is comfortable with volume guidelines and denies concerns or questions. Suggest mother call if help is needed.      Maternal Data    Feeding Feeding Type: Breast Fed  LATCH Score                   Interventions    Lactation Tools Discussed/Used     Consult Status      Carlye Grippe 03/29/2019, 11:20 AM

## 2019-03-29 NOTE — Discharge Instructions (Signed)

## 2019-04-04 ENCOUNTER — Other Ambulatory Visit: Payer: Self-pay | Admitting: Cardiology

## 2019-04-04 DIAGNOSIS — Z20822 Contact with and (suspected) exposure to covid-19: Secondary | ICD-10-CM

## 2019-04-05 LAB — NOVEL CORONAVIRUS, NAA: SARS-CoV-2, NAA: NOT DETECTED

## 2019-04-14 ENCOUNTER — Inpatient Hospital Stay (HOSPITAL_COMMUNITY): Admission: AD | Admit: 2019-04-14 | Payer: BC Managed Care – PPO | Source: Home / Self Care

## 2019-04-14 ENCOUNTER — Inpatient Hospital Stay (HOSPITAL_COMMUNITY): Payer: BC Managed Care – PPO

## 2019-06-13 DIAGNOSIS — Z8632 Personal history of gestational diabetes: Secondary | ICD-10-CM | POA: Diagnosis not present

## 2019-07-15 ENCOUNTER — Ambulatory Visit: Payer: BC Managed Care – PPO

## 2020-09-04 IMAGING — US OBSTETRIC <14 WK ULTRASOUND
1 series · 15 of 19 positions shown · non-contrast
Comparison: None.

CLINICAL DATA: Pregnant, vaginal bleeding

EXAM:
OBSTETRIC <14 WK ULTRASOUND
TECHNIQUE: Transabdominal ultrasound was performed for evaluation of the
gestation as well as the maternal uterus and adnexal regions.

[Series 1: obstetric <14 wk ultrasound · 19 acquisitions, 15 frames shown]
[im 1/19]
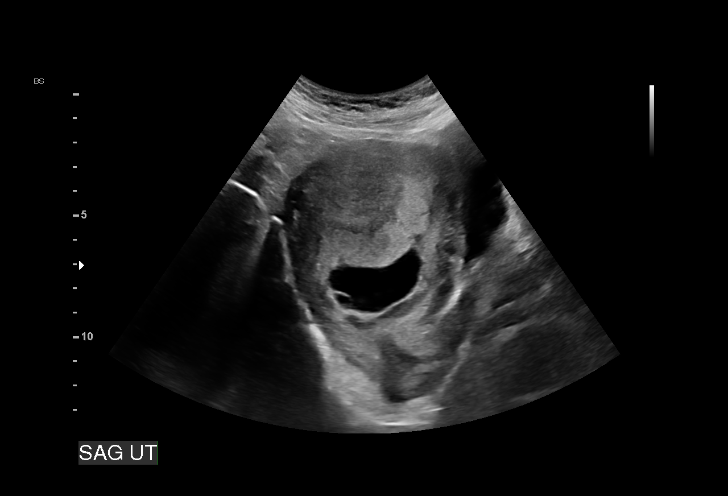
[im 2/19]
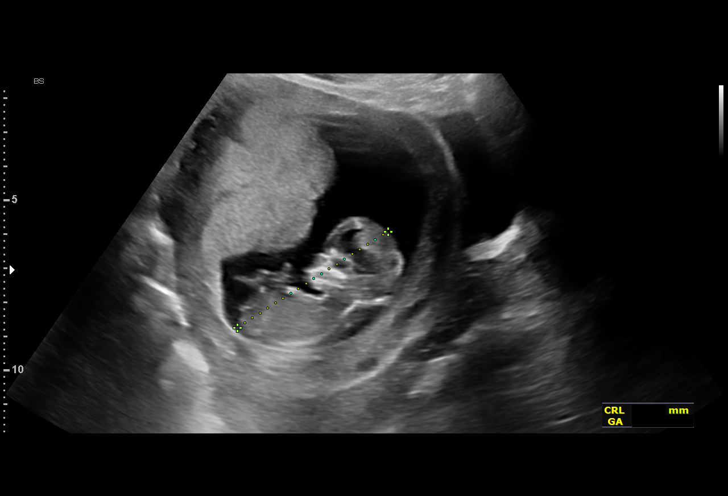
[im 4/19]
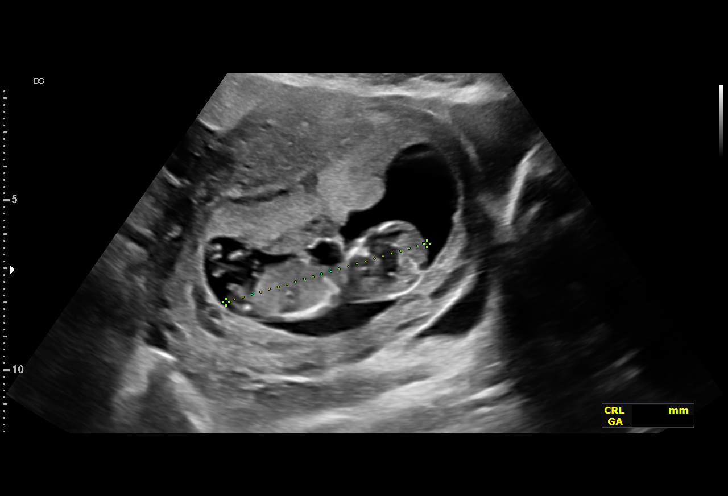
[im 5/19]
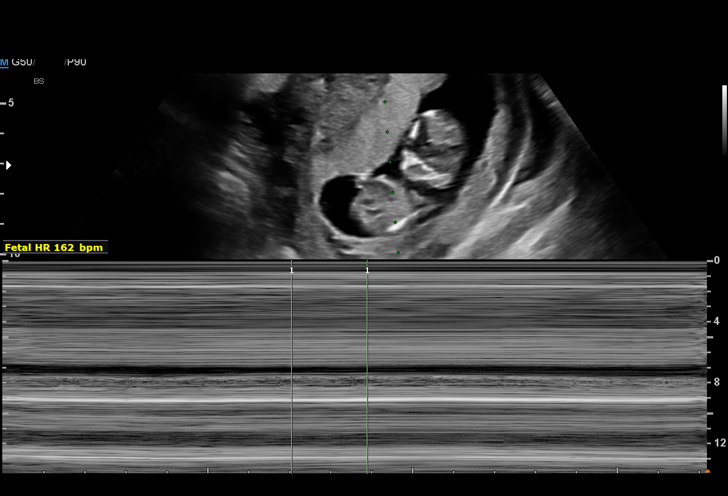
[im 6/19]
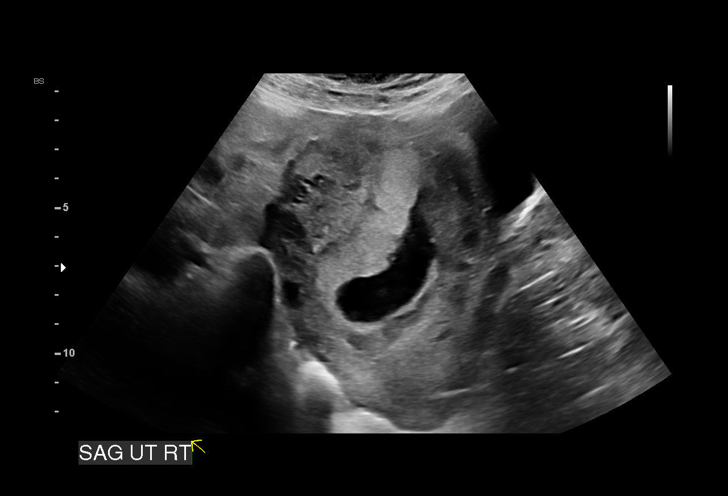
[im 7/19]
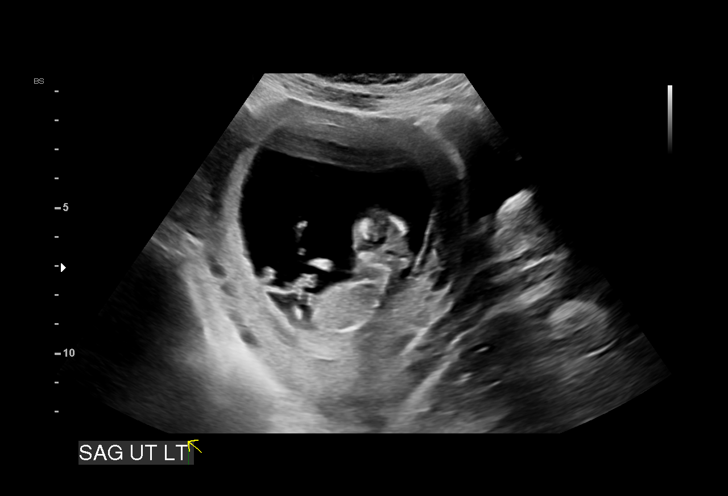
[im 9/19]
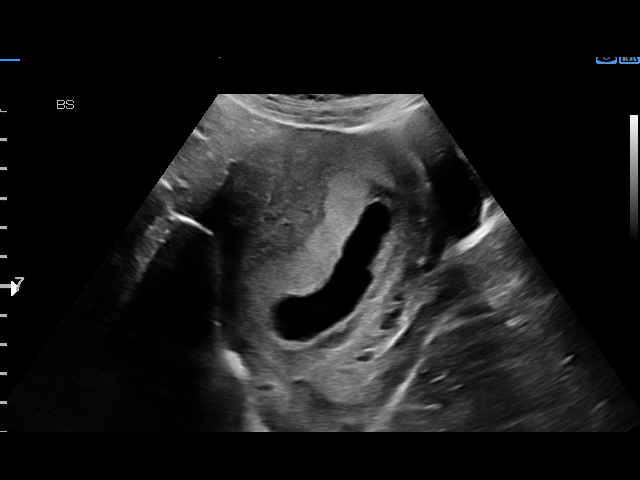
[im 10/19]
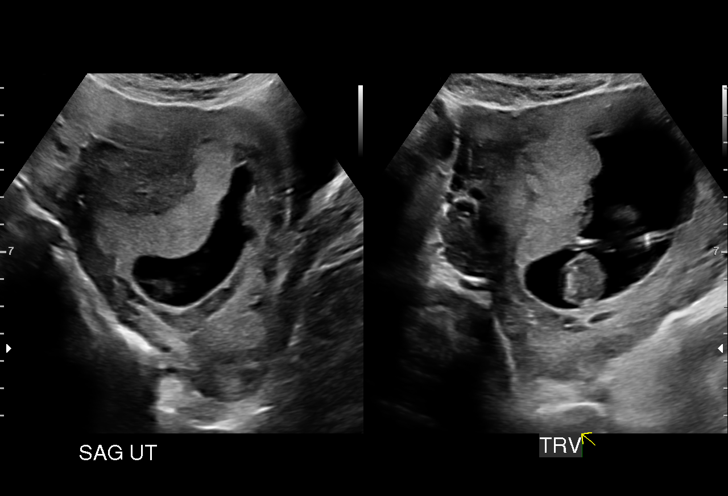
[im 11/19]
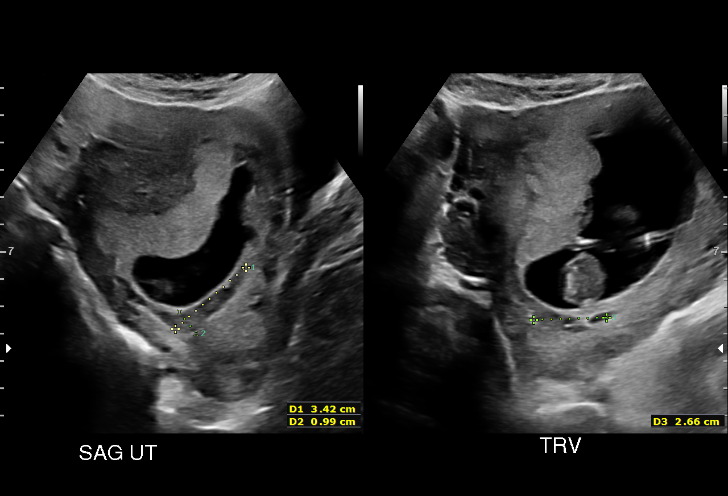
[im 13/19]
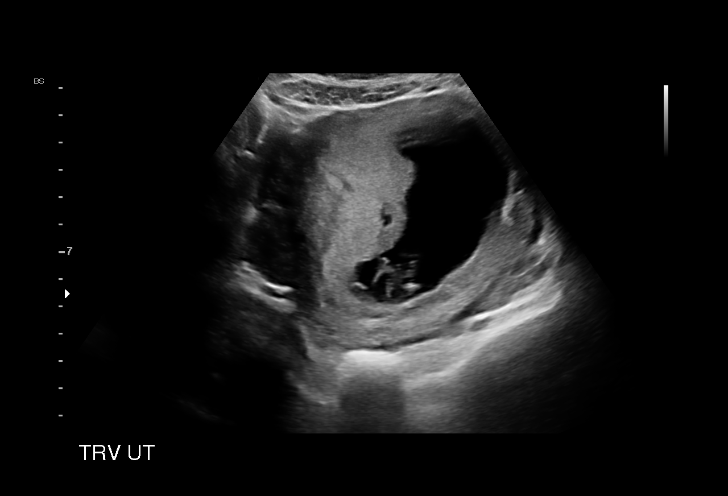
[im 14/19]
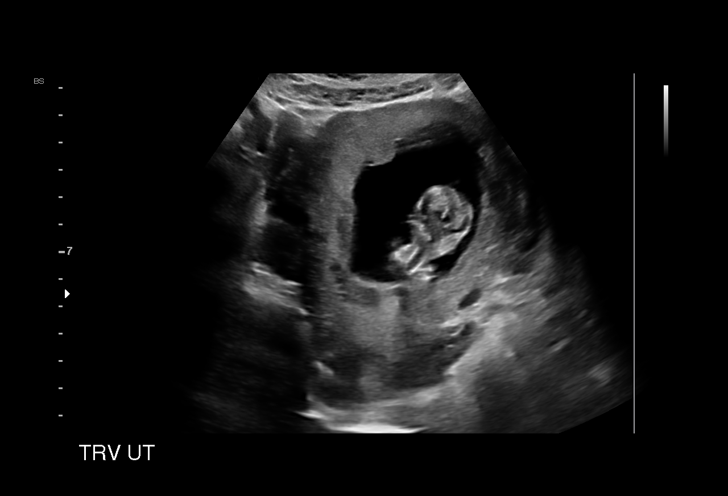
[im 15/19]
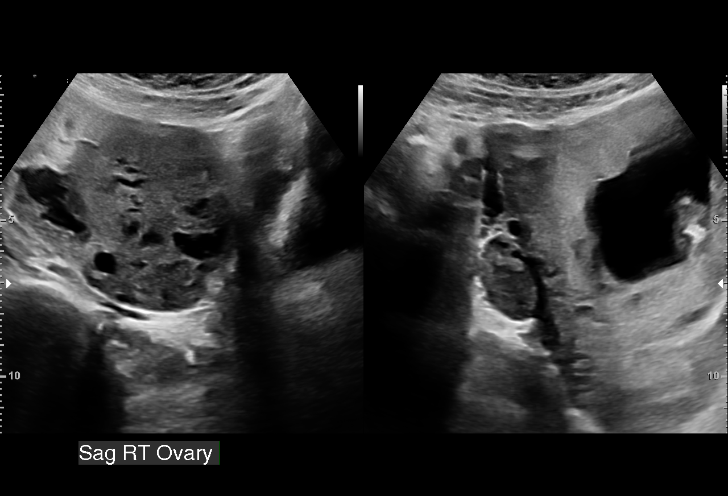
[im 16/19]
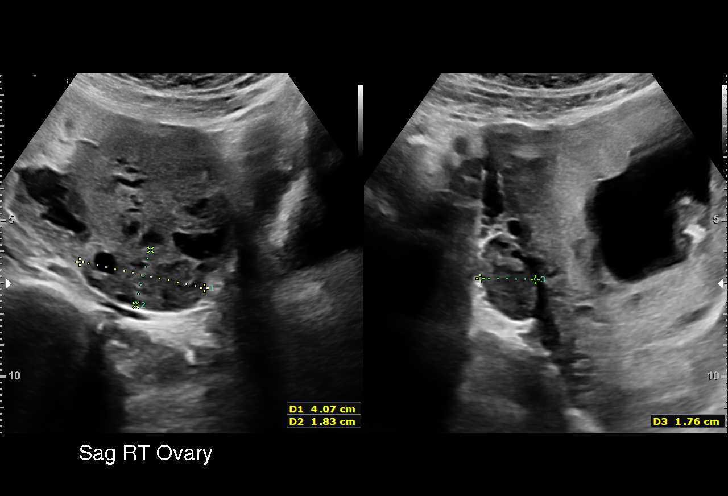
[im 18/19]
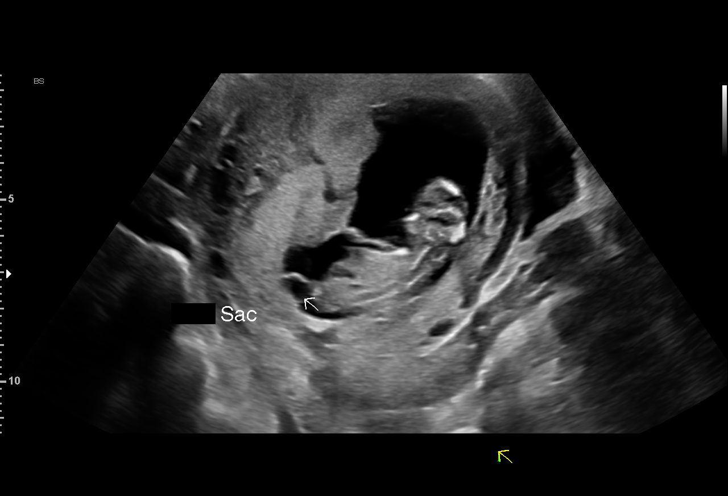
[im 19/19]
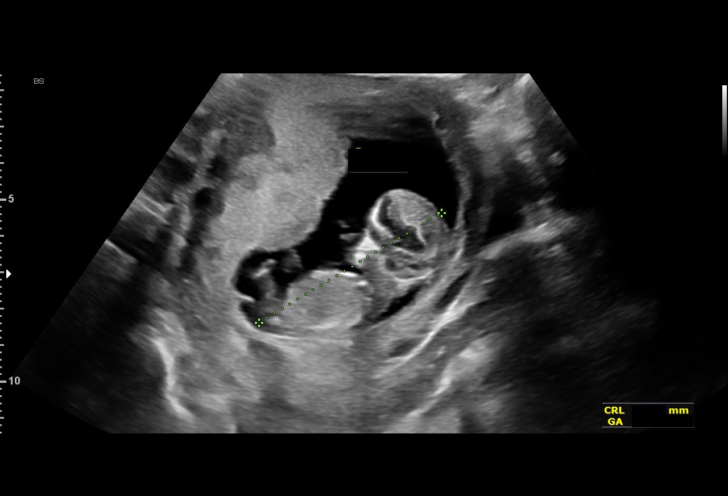

[15 of 19 positions shown; findings below may reference images not displayed]

FINDINGS: Intrauterine gestational sac: Single

Yolk sac:  Visualized.

Embryo:  Visualized.

Cardiac Activity: Visualized.

Heart Rate: 162 bpm

CRL:   60.4 mm   12 w 4 d                  US EDC: 04/13/2019

Subchorionic hemorrhage:  Small subchronic hemorrhage.

Maternal uterus/adnexae: Right ovary is within normal limits. Left
ovary is not discretely visualized.

No free fluid.
IMPRESSION: Single live intrauterine gestation, with estimated gestational age
12 weeks 4 days by crown-rump length, as above.

## 2020-12-01 DIAGNOSIS — Z03818 Encounter for observation for suspected exposure to other biological agents ruled out: Secondary | ICD-10-CM | POA: Diagnosis not present

## 2020-12-04 DIAGNOSIS — U071 COVID-19: Secondary | ICD-10-CM | POA: Diagnosis not present

## 2022-11-08 ENCOUNTER — Emergency Department: Admit: 2022-11-09 | Payer: BLUE CROSS/BLUE SHIELD

## 2022-11-08 DIAGNOSIS — O26892 Other specified pregnancy related conditions, second trimester: Secondary | ICD-10-CM

## 2022-11-08 DIAGNOSIS — O26893 Other specified pregnancy related conditions, third trimester: Secondary | ICD-10-CM

## 2022-11-08 NOTE — Progress Notes (Addendum)
Received a call from ultrasound asking if patient is able to be scanned downstairs. Spoke with Dr Althea Grimmer, received verbal order to allow patient downstairs for an ultrasound but to keep patient on the monitor until transport arrives. [place] and Primary RN updated.     2354 Patient taken off monitors for transport to ultrasound

## 2022-11-08 NOTE — ED Provider Notes (Signed)
[facility]  Obstetrics Emergency Department Treatment Note        Patient: Ariel Horne Age: 37 y.o. Sex: female    Date of Birth: 1986/03/02 Admit Date: 11/08/2022 PCP: Aviva Kluver   MRN: [id number]  CSN: [id number]     Room: 3114/3114 Time Dictated: 12:43 AM        Chief Complaint   Abdominal pain        History of Present Illness   Ariel Horne is a 37 y.o. [id number] with an estimated gestational age of 38w1dwith Estimated Date of Delivery: 02/06/23 noted.. She presents to [facility] complaining of abdominal pain and pelvic pressure that comes and goes since this afternoon.     Prenatal care by:     PNC:          OB History   Gravida Para Term Preterm AB Living   1             SAB IAB Ectopic Molar Multiple Live Births                    # Outcome Date GA Lbr Len/2nd Weight Sex Delivery Anes PTL Lv   1 Current                 Review of Systems   Review of Systems   Constitutional: Negative.  Negative for fever.   Gastrointestinal:  Positive for abdominal pain.   Genitourinary:  Negative for vaginal bleeding and vaginal discharge.   Neurological: Negative.  Negative for headaches.        Past Medical/Surgical History   No past medical history on file.  No past surgical history on file.    Social History     Social History     Socioeconomic History    Marital status: Single     Spouse name: Not on file    Number of children: Not on file    Years of education: Not on file    Highest education level: Not on file   Occupational History    Not on file   Tobacco Use    Smoking status: Not on file    Smokeless tobacco: Not on file   Substance and Sexual Activity    Alcohol use: Not on file    Drug use: Not on file    Sexual activity: Not on file   Other Topics Concern    Not on file   Social History Narrative    Not on file     Social Determinants of Health     Financial Resource Strain: Not on file   Food Insecurity: Not on file   Transportation Needs: Not on file   Physical Activity: Not on file    Stress: Not on file   Social Connections: Not on file   Intimate Partner Violence: Not on file   Housing Stability: Not on file       Family History   No family history on file.    Current Medications     No current facility-administered medications for this encounter.     No current outpatient medications on file.        Allergies   No Known Allergies    Physical Exam   Patient Vitals for the past 24 hrs:   BP Temp Temp src Pulse Resp SpO2   11/09/22 0030 -- -- -- -- -- 100 %   11/09/22 0026 137/79 -- -- 92 -- --  11/08/22 2225 135/81 98.4 F (36.9 C) Oral (!) 103 18 100 %      ED Triage Vitals   Enc Vitals Group      BP       Pulse       Resp       Temp       Temp src       SpO2       Weight       Height       Head Circumference       Peak Flow       Pain Score       Pain Loc       Pain Edu?       Excl. in GC?         Physical Exam  Cardiovascular:      Rate and Rhythm: Normal rate.      Pulses: Normal pulses.   Pulmonary:      Effort: Pulmonary effort is normal.   Abdominal:      Tenderness: There is no guarding.   Neurological:      Mental Status: She is alert.           OB Exam     Cervical Exam: deferred  Uterine Activity: none  Fetal Heart Rate: 150, category 1 and reactive/reassuring, moderate variability, accelerations present, no decelerations  I personally reviewed and interpreted the fetal non-stress test.         Diagnostic Studies   Lab:   Recent Results (from the past 12 hour(s))   Urinalysis    Collection Time: 11/08/22 10:30 PM   Result Value Ref Range    Color, UA Yellow Yellow,Straw      Clarity, UA Clear Clear      Glucose, Ur Negative Negative mg/dl    Bilirubin, Urine Negative Negative      Ketones, Urine Negative Negative mg/dl    Specific Gravity, UA 1.015 1.005 - 1.030      Blood, Urine Trace-intact (A) Negative      pH, Urine 7.5 5.0 - 9.0      Protein, Urine Negative Negative mg/dl    Urobilinogen, Urine 0.2 E.U./dL 0.0 - 1.0 mg/dl    Nitrite, Urine Negative Negative      Leukocyte  Esterase, Urine Small (A) Negative     Microscopic Urinalysis    Collection Time: 11/08/22 10:30 PM   Result Value Ref Range    Squam Epithel, UA 15-29 NEGATIVE,OCCASIONAL,1-4,5-9,10-14,15-29 /LPF    WBC, UA 1-4 (A) NEGATIVE /HPF    RBC, UA 1-4 (A) NEGATIVE /HPF    BACTERIA, URINE 1+ (A) NEGATIVE /HPF     Labs Reviewed   URINALYSIS - Abnormal; Notable for the following components:       Result Value    Blood, Urine Trace-intact (*)     Leukocyte Esterase, Urine Small (*)     All other components within normal limits   MICROSCOPIC URINALYSIS - Abnormal; Notable for the following components:    WBC, UA 1-4 (*)     RBC, UA 1-4 (*)     BACTERIA, URINE 1+ (*)     All other components within normal limits         Imaging:    US OB 1 OR MORE FETUS LIMITED    Result Date: 11/09/2022  US OB 1 OR MORE FETUS LIMITED, US FETAL BIOPHYSICAL PROFILE WO NON STRESS TESTING INDICATION: no access to prenatal records COMPARISON: No  relevant studies. TECHNIQUE: US OB 1 OR MORE FETUS LIMITED, US FETAL BIOPHYSICAL PROFILE WO NON STRESS TESTING FINDINGS: Cervix: Cervix is closed and measures 4.2 cm Fetus: -Presentation: Cephalic -Orientation: Longitudinal -Placenta location: Fundal -Placenta grade: 1 -Placenta previa: No -Heart rate: 147 beats per minute. Measurements: -Biparietal diameter measures 6.3 cm, compatible with a 25 week, 5 day fetus. -Head circumference measures Head circumference 24.2 cm, compatible with a 26 week, 2 day day fetus. -Abdominal circumference measures AC 23.2 cm, compatible with a 27 week, day 4 fetus. -Femur length measures FL 4.7 cm, compatible with a 25 week, 6 day fetus. -Head circumference/abdominal circumference ratio: 1.04, slightly decreased Estimated fetal age by ultrasound is 26 week, 3 day fetus. Estimated fetal weight: 966 grams. 2 lbs, 2 oz. Amniotic fluid: -AFI measures 13.4 -Single deep pocket measures 4.1 cm. Biophysical profile: 8/8 Fetal tone: 2 Fetal breathing: 2 Fetal movements: 2 Amniotic  fluid: 2 AUA %: 48% LMP %: 17%     IMPRESSION: 1.  Single living intrauterine pregnancy with estimated gestational age of [redacted] weeks 3 days. 2.  Biophysical profile: 8/8 Electronically signed by: Linus Galas, DO 11/09/2022 12:36 AM EDT          Workstation ID: [id number]     US FETAL BIOPHYSICAL PROFILE WO NON STRESS TESTING    Result Date: 11/09/2022  US OB 1 OR MORE FETUS LIMITED, US FETAL BIOPHYSICAL PROFILE WO NON STRESS TESTING INDICATION: no access to prenatal records COMPARISON: No relevant studies. TECHNIQUE: US OB 1 OR MORE FETUS LIMITED, US FETAL BIOPHYSICAL PROFILE WO NON STRESS TESTING FINDINGS: Cervix: Cervix is closed and measures 4.2 cm Fetus: -Presentation: Cephalic -Orientation: Longitudinal -Placenta location: Fundal -Placenta grade: 1 -Placenta previa: No -Heart rate: 147 beats per minute. Measurements: -Biparietal diameter measures 6.3 cm, compatible with a 25 week, 5 day fetus. -Head circumference measures Head circumference 24.2 cm, compatible with a 26 week, 2 day day fetus. -Abdominal circumference measures AC 23.2 cm, compatible with a 27 week, day 4 fetus. -Femur length measures FL 4.7 cm, compatible with a 25 week, 6 day fetus. -Head circumference/abdominal circumference ratio: 1.04, slightly decreased Estimated fetal age by ultrasound is 26 week, 3 day fetus. Estimated fetal weight: 966 grams. 2 lbs, 2 oz. Amniotic fluid: -AFI measures 13.4 -Single deep pocket measures 4.1 cm. Biophysical profile: 8/8 Fetal tone: 2 Fetal breathing: 2 Fetal movements: 2 Amniotic fluid: 2 AUA %: 48% LMP %: 17%     IMPRESSION: 1.  Single living intrauterine pregnancy with estimated gestational age of [redacted] weeks 3 days. 2.  Biophysical profile: 8/8 Electronically signed by: Linus Galas, DO 11/09/2022 12:36 AM EDT          Workstation ID: [id number]       ED Course     BP 137/79   Pulse 92   Temp 98.4 F (36.9 C) (Oral)   Resp 18   SpO2 100%            Medications - No data to display    Medical  Decision Making   Abdominal pain in pregnancy- NST, U/A    Final Diagnosis / MDM     IUP [redacted]w[redacted]d  Abdominal pain in pregnancy    Disposition   Discharge home   Encourage hydration        Etta Grandchild, MD  [facility]  November 09, 2022  12:43 AM      My signature above authenticates this document and my orders, the final  diagnosis(es), discharge prescription(s), and instructions in the Epic    record.  If you have any questions please contact [phone].  Nursing notes have been reviewed by myself.

## 2022-11-08 NOTE — Progress Notes (Addendum)
2223: Patient presents to [facility] with c/o "more braxton hicks" than normal.  States she is from out of town and was nervous to drive 4 hours tomorrow until she gets checked out.  Denies LOF or vaginal bleeding.  States good fetal movement.  2230: Dr. Althea Grimmer in room to assess patient.  History obtained.  2235: Urine sent to lab.  2256: Fetus off monitor, audibly very active.  At bedside to attempt to trace.  2314: Three Rns attempting to trace infant, patient turned side to side. States fetus is moving, fetus audibly active.  Dr. Althea Grimmer notified of difficulty tracing infant.  Will order BPP.  2345: Able to trace infant intermittently.  Awaiting transport for BPP/ultrasound.  0001: Call to lab to request status of urinalysis.  Lab states completing it now.  0024: Patient returned from [place].  Back on monitor.  40: Dr. Althea Grimmer in; patient to be discharged.  0050: Discharge instructions given, patient states understanding.  To follow up with regular OB (in [place]), scheduled on Thursday.  8657: Discharged ambulatory.

## 2022-11-09 ENCOUNTER — Inpatient Hospital Stay
Admit: 2022-11-09 | Discharge: 2022-11-09 | Disposition: A | Discharge status: Home / Self Care | Payer: BLUE CROSS/BLUE SHIELD | Attending: Specialist

## 2022-11-09 LAB — URINALYSIS
Bilirubin, Urine: NEGATIVE
Glucose, Ur: NEGATIVE mg/dl
Ketones, Urine: NEGATIVE mg/dl
Nitrite, Urine: NEGATIVE
Protein, Urine: NEGATIVE mg/dl
Specific Gravity, UA: 1.015 (ref 1.005–1.030)
Urobilinogen, Urine: 0.2 mg/dl (ref 0.0–1.0)
pH, Urine: 7.5 (ref 5.0–9.0)

## 2022-11-09 LAB — MICROSCOPIC URINALYSIS

## 2022-11-09 NOTE — Discharge Instructions (Signed)
I understand that if any problems occur once I am at home I am to contact my physician.    I understand and acknowledge receipt of the instructions indicated above.       _____________________________________________                                                        Physician's or R.N.'s Signature                Date/Time                          _____________________________________________                                                        Patient or Representative Signature         Date/Time          Home Undelivered Discharge Instructions    After Discharge Orders:    Follow up with your OB as scheduled.              Diet:  normal diet as tolerated    Rest: normal activity as tolerated    Other instructions: Do kick counts once a day on your baby. Choose the time of day your baby is most active. Get in a comfortable lying or sitting position and time how long it takes to feel 10 kicks, twists, turns, swishes, or rolls. Call your physician or midwife if there have not been 10 kicks in 2 hours    Call physician or midwife, return to Labor and Delivery, call 911, or go to the nearest Emergency Room if: increased leakage or fluid, contractions more than  10 per  2 hours, decreased fetal movement, persistent low back pain or cramping, or bleeding from vaginal area
